# Patient Record
Sex: Female | Born: 1990 | Race: Black or African American | Hispanic: No | Marital: Married | Smoking: Never smoker
Health system: Southern US, Community
[De-identification: ages and names within clinical notes are randomized; demographics above are authoritative.]

## PROBLEM LIST (undated history)

## (undated) DIAGNOSIS — D649 Anemia, unspecified: Secondary | ICD-10-CM

## (undated) DIAGNOSIS — G5600 Carpal tunnel syndrome, unspecified upper limb: Secondary | ICD-10-CM

## (undated) DIAGNOSIS — Z8742 Personal history of other diseases of the female genital tract: Secondary | ICD-10-CM

## (undated) DIAGNOSIS — O26899 Other specified pregnancy related conditions, unspecified trimester: Secondary | ICD-10-CM

## (undated) DIAGNOSIS — Z87440 Personal history of urinary (tract) infections: Secondary | ICD-10-CM

## (undated) HISTORY — DX: Anemia, unspecified: D64.9

## (undated) HISTORY — PX: WISDOM TOOTH EXTRACTION: SHX21

## (undated) HISTORY — DX: Other specified pregnancy related conditions, unspecified trimester: O26.899

## (undated) HISTORY — DX: Personal history of urinary (tract) infections: Z87.440

## (undated) HISTORY — DX: Other specified pregnancy related conditions, unspecified trimester: G56.00

## (undated) HISTORY — DX: Personal history of other diseases of the female genital tract: Z87.42

---

## 2012-08-17 ENCOUNTER — Encounter: Payer: Self-pay | Admitting: Obstetrics and Gynecology

## 2012-08-17 ENCOUNTER — Ambulatory Visit (INDEPENDENT_AMBULATORY_CARE_PROVIDER_SITE_OTHER): Payer: Managed Care, Other (non HMO) | Admitting: Obstetrics and Gynecology

## 2012-08-17 VITALS — BP 112/72 | HR 78 | Wt 220.0 lb

## 2012-08-17 DIAGNOSIS — Z3046 Encounter for surveillance of implantable subdermal contraceptive: Secondary | ICD-10-CM

## 2012-08-17 DIAGNOSIS — N926 Irregular menstruation, unspecified: Secondary | ICD-10-CM

## 2012-08-17 DIAGNOSIS — N912 Amenorrhea, unspecified: Secondary | ICD-10-CM

## 2012-08-17 DIAGNOSIS — R5381 Other malaise: Secondary | ICD-10-CM

## 2012-08-17 DIAGNOSIS — R5383 Other fatigue: Secondary | ICD-10-CM

## 2012-08-17 LAB — CBC
Hemoglobin: 12.9 g/dL (ref 12.0–15.0)
MCH: 25.8 pg — ABNORMAL LOW (ref 26.0–34.0)
MCHC: 32.2 g/dL (ref 30.0–36.0)

## 2012-08-17 NOTE — Patient Instructions (Addendum)
Call [facility] [phone]:  -for temperature of 100.4 degrees Fahrenheit or more -pain not improved with over the counter pain medications (Ibuprofen, Advil, Aleve,     Tylenol or acetaminophen) -for excessive bleeding from insertion site -for excessive swelling redness or green drainage from your insertion site -for any other concerns -keep insertion site clean, dry and covered  for 24 hours -you may remove pressure bandage in 1-4 hours  Multivitamin with at least 400 mcg of folic acid (or more)  OR   Prenatal Vitamins

## 2012-08-17 NOTE — Progress Notes (Signed)
22 YO desiring conception presents for Nexplanon removal. Also reports feeling fatigued for the past several months.  She's been taking iron for the past weeks and feels somewhat better.  Has had episodes of heavy bleeding but not regularly.  O: Nexplanon removed per protocol from medial left upper arm without difficulty and incision closed with Benzoin and ster-strips, dressed with sterile band-aids and 4 x 4 gauze with Kling pressure dressing   A:  Nexplanon removal      Desire to conceive  P:  Multivitamin with at least 400 mcg of folic acid  daily       Call [facility] [phone]:  -for temperature of 100.4 degrees Fahrenheit or more -pain not improved with over the counter pain medications (Ibuprofen, Advil, Aleve,     Tylenol or acetaminophen) -for excessive bleeding from insertion site -for excessive swelling redness or green drainage from your insertion site -for any other concerns -keep insertion site clean, dry and covered  for 24 hours -you may remove pressure bandage in 1-4 hours  RTO- 1 week  Errika Narvaiz, PA-C

## 2012-08-18 ENCOUNTER — Telehealth: Payer: Self-pay | Admitting: Obstetrics and Gynecology

## 2012-08-18 NOTE — Telephone Encounter (Signed)
Called patient to inform of normal CBC.  Left message on recording.  Ernie Sagrero, PA-C

## 2012-08-29 LAB — OB RESULTS CONSOLE HIV ANTIBODY (ROUTINE TESTING): HIV: NONREACTIVE

## 2012-08-29 LAB — OB RESULTS CONSOLE HEPATITIS B SURFACE ANTIGEN: Hepatitis B Surface Ag: NEGATIVE

## 2012-08-29 LAB — OB RESULTS CONSOLE ABO/RH: RH Type: POSITIVE

## 2012-08-29 LAB — OB RESULTS CONSOLE RUBELLA ANTIBODY, IGM: RUBELLA: IMMUNE

## 2012-09-02 ENCOUNTER — Encounter: Payer: Managed Care, Other (non HMO) | Admitting: Obstetrics and Gynecology

## 2014-04-28 ENCOUNTER — Other Ambulatory Visit: Payer: Self-pay | Admitting: Obstetrics and Gynecology

## 2014-04-29 ENCOUNTER — Inpatient Hospital Stay (HOSPITAL_COMMUNITY)
Admission: AD | Admit: 2014-04-29 | Discharge: 2014-05-02 | DRG: 765 | Disposition: A | Discharge status: Home / Self Care | Payer: Managed Care, Other (non HMO) | Source: Ambulatory Visit | Attending: Obstetrics and Gynecology | Admitting: Obstetrics and Gynecology

## 2014-04-29 ENCOUNTER — Encounter (HOSPITAL_COMMUNITY): Payer: Self-pay | Admitting: *Deleted

## 2014-04-29 ENCOUNTER — Encounter (HOSPITAL_COMMUNITY)
Admission: AD | Disposition: A | Discharge status: Home / Self Care | Payer: Self-pay | Source: Ambulatory Visit | Attending: Obstetrics and Gynecology

## 2014-04-29 ENCOUNTER — Encounter (HOSPITAL_COMMUNITY): Payer: Managed Care, Other (non HMO) | Admitting: Registered Nurse

## 2014-04-29 ENCOUNTER — Inpatient Hospital Stay (HOSPITAL_COMMUNITY): Payer: Managed Care, Other (non HMO) | Admitting: Registered Nurse

## 2014-04-29 DIAGNOSIS — Z6841 Body Mass Index (BMI) 40.0 and over, adult: Secondary | ICD-10-CM

## 2014-04-29 DIAGNOSIS — K219 Gastro-esophageal reflux disease without esophagitis: Secondary | ICD-10-CM | POA: Diagnosis present

## 2014-04-29 DIAGNOSIS — O322XX0 Maternal care for transverse and oblique lie, not applicable or unspecified: Principal | ICD-10-CM | POA: Diagnosis present

## 2014-04-29 DIAGNOSIS — O9902 Anemia complicating childbirth: Secondary | ICD-10-CM | POA: Diagnosis present

## 2014-04-29 DIAGNOSIS — O99214 Obesity complicating childbirth: Secondary | ICD-10-CM | POA: Diagnosis present

## 2014-04-29 DIAGNOSIS — D649 Anemia, unspecified: Secondary | ICD-10-CM | POA: Diagnosis present

## 2014-04-29 DIAGNOSIS — Z803 Family history of malignant neoplasm of breast: Secondary | ICD-10-CM | POA: Diagnosis not present

## 2014-04-29 DIAGNOSIS — Z833 Family history of diabetes mellitus: Secondary | ICD-10-CM | POA: Diagnosis not present

## 2014-04-29 DIAGNOSIS — Z800 Family history of malignant neoplasm of digestive organs: Secondary | ICD-10-CM

## 2014-04-29 DIAGNOSIS — E669 Obesity, unspecified: Secondary | ICD-10-CM | POA: Diagnosis present

## 2014-04-29 LAB — ABO/RH: ABO/RH(D): A POS

## 2014-04-29 LAB — TYPE AND SCREEN
ABO/RH(D): A POS
ANTIBODY SCREEN: NEGATIVE

## 2014-04-29 LAB — CBC
HEMATOCRIT: 34.3 % — AB (ref 36.0–46.0)
Hemoglobin: 11.2 g/dL — ABNORMAL LOW (ref 12.0–15.0)
MCH: 26.4 pg (ref 26.0–34.0)
MCHC: 32.7 g/dL (ref 30.0–36.0)
MCV: 80.9 fL (ref 78.0–100.0)
Platelets: 172 10*3/uL (ref 150–400)
RBC: 4.24 MIL/uL (ref 3.87–5.11)
RDW: 15.3 % (ref 11.5–15.5)
WBC: 7.3 10*3/uL (ref 4.0–10.5)

## 2014-04-29 LAB — RPR

## 2014-04-29 SURGERY — Surgical Case
Anesthesia: Spinal | Site: Abdomen

## 2014-04-29 MED ORDER — NALBUPHINE HCL 10 MG/ML IJ SOLN: 5.0000 mg | INTRAMUSCULAR | Status: DC | PRN

## 2014-04-29 MED ORDER — LACTATED RINGERS IV SOLN
INTRAVENOUS | Status: DC
  Administered 2014-04-29: 18:00:00 via INTRAVENOUS

## 2014-04-29 MED ORDER — FERROUS SULFATE 325 (65 FE) MG PO TABS
325.0000 mg | ORAL_TABLET | Freq: Two times a day (BID) | ORAL | Status: DC
  Administered 2014-04-29: 325 mg via ORAL
  Filled 2014-04-29: qty 1

## 2014-04-29 MED ORDER — KETOROLAC TROMETHAMINE 30 MG/ML IJ SOLN
INTRAMUSCULAR | Status: AC
  Filled 2014-04-29: qty 1

## 2014-04-29 MED ORDER — DIPHENHYDRAMINE HCL 25 MG PO CAPS: 25.0000 mg | ORAL_CAPSULE | Freq: Four times a day (QID) | ORAL | Status: DC | PRN

## 2014-04-29 MED ORDER — LANOLIN HYDROUS EX OINT: 1.0000 "application " | TOPICAL_OINTMENT | CUTANEOUS | Status: DC | PRN

## 2014-04-29 MED ORDER — SIMETHICONE 80 MG PO CHEW
80.0000 mg | CHEWABLE_TABLET | Freq: Three times a day (TID) | ORAL | Status: DC
  Administered 2014-04-29 – 2014-05-02 (×8): 80 mg via ORAL
  Filled 2014-04-29 (×8): qty 1

## 2014-04-29 MED ORDER — NALBUPHINE HCL 10 MG/ML IJ SOLN: 5.0000 mg | Freq: Once | INTRAMUSCULAR | Status: AC | PRN

## 2014-04-29 MED ORDER — FENTANYL CITRATE 0.05 MG/ML IJ SOLN
INTRAMUSCULAR | Status: DC | PRN
  Administered 2014-04-29: 10 ug via INTRATHECAL

## 2014-04-29 MED ORDER — TETANUS-DIPHTH-ACELL PERTUSSIS 5-2.5-18.5 LF-MCG/0.5 IM SUSP: 0.5000 mL | Freq: Once | INTRAMUSCULAR | Status: DC

## 2014-04-29 MED ORDER — SODIUM CHLORIDE 0.9 % IJ SOLN: 3.0000 mL | INTRAMUSCULAR | Status: DC | PRN

## 2014-04-29 MED ORDER — OXYCODONE-ACETAMINOPHEN 5-325 MG PO TABS: 2.0000 | ORAL_TABLET | ORAL | Status: DC | PRN

## 2014-04-29 MED ORDER — OXYCODONE-ACETAMINOPHEN 5-325 MG PO TABS
1.0000 | ORAL_TABLET | ORAL | Status: DC | PRN
  Administered 2014-04-30 – 2014-05-02 (×2): 1 via ORAL
  Filled 2014-04-29 (×2): qty 1

## 2014-04-29 MED ORDER — OXYTOCIN 10 UNIT/ML IJ SOLN
INTRAMUSCULAR | Status: AC
  Filled 2014-04-29: qty 4

## 2014-04-29 MED ORDER — METHYLERGONOVINE MALEATE 0.2 MG/ML IJ SOLN: 0.2000 mg | INTRAMUSCULAR | Status: DC | PRN

## 2014-04-29 MED ORDER — ZOLPIDEM TARTRATE 5 MG PO TABS: 5.0000 mg | ORAL_TABLET | Freq: Every evening | ORAL | Status: DC | PRN

## 2014-04-29 MED ORDER — SIMETHICONE 80 MG PO CHEW: 80.0000 mg | CHEWABLE_TABLET | ORAL | Status: DC | PRN

## 2014-04-29 MED ORDER — ONDANSETRON HCL 4 MG PO TABS: 4.0000 mg | ORAL_TABLET | ORAL | Status: DC | PRN

## 2014-04-29 MED ORDER — FENTANYL CITRATE 0.05 MG/ML IJ SOLN: 25.0000 ug | INTRAMUSCULAR | Status: DC | PRN

## 2014-04-29 MED ORDER — ONDANSETRON HCL 4 MG/2ML IJ SOLN
INTRAMUSCULAR | Status: DC | PRN
  Administered 2014-04-29: 4 mg via INTRAVENOUS

## 2014-04-29 MED ORDER — SIMETHICONE 80 MG PO CHEW
80.0000 mg | CHEWABLE_TABLET | ORAL | Status: DC
  Administered 2014-04-30 – 2014-05-02 (×3): 80 mg via ORAL
  Filled 2014-04-29 (×3): qty 1

## 2014-04-29 MED ORDER — ONDANSETRON HCL 4 MG/2ML IJ SOLN
INTRAMUSCULAR | Status: AC
  Filled 2014-04-29: qty 2

## 2014-04-29 MED ORDER — SENNOSIDES-DOCUSATE SODIUM 8.6-50 MG PO TABS
2.0000 | ORAL_TABLET | ORAL | Status: DC
  Administered 2014-04-30 – 2014-05-02 (×3): 2 via ORAL
  Filled 2014-04-29 (×3): qty 2

## 2014-04-29 MED ORDER — IBUPROFEN 600 MG PO TABS
600.0000 mg | ORAL_TABLET | Freq: Four times a day (QID) | ORAL | Status: DC
  Administered 2014-04-29 – 2014-05-02 (×12): 600 mg via ORAL
  Filled 2014-04-29 (×12): qty 1

## 2014-04-29 MED ORDER — KETOROLAC TROMETHAMINE 30 MG/ML IJ SOLN: 30.0000 mg | Freq: Four times a day (QID) | INTRAMUSCULAR | Status: AC | PRN

## 2014-04-29 MED ORDER — DEXAMETHASONE SODIUM PHOSPHATE 4 MG/ML IJ SOLN
INTRAMUSCULAR | Status: DC | PRN
  Administered 2014-04-29: 4 mg via INTRAVENOUS

## 2014-04-29 MED ORDER — ONDANSETRON HCL 4 MG/2ML IJ SOLN: 4.0000 mg | INTRAMUSCULAR | Status: DC | PRN

## 2014-04-29 MED ORDER — SCOPOLAMINE 1 MG/3DAYS TD PT72
MEDICATED_PATCH | TRANSDERMAL | Status: AC
  Administered 2014-04-29: 1.5 mg via TRANSDERMAL
  Filled 2014-04-29: qty 1

## 2014-04-29 MED ORDER — DEXAMETHASONE SODIUM PHOSPHATE 10 MG/ML IJ SOLN
INTRAMUSCULAR | Status: AC
  Filled 2014-04-29: qty 1

## 2014-04-29 MED ORDER — LACTATED RINGERS IV SOLN
Freq: Once | INTRAVENOUS | Status: AC
  Administered 2014-04-29 (×3): via INTRAVENOUS

## 2014-04-29 MED ORDER — BUPIVACAINE HCL (PF) 0.25 % IJ SOLN
INTRAMUSCULAR | Status: AC
  Filled 2014-04-29: qty 30

## 2014-04-29 MED ORDER — DIPHENHYDRAMINE HCL 50 MG/ML IJ SOLN
12.5000 mg | INTRAMUSCULAR | Status: DC | PRN
  Administered 2014-04-30: 12.5 mg via INTRAVENOUS
  Filled 2014-04-29: qty 1

## 2014-04-29 MED ORDER — CEFAZOLIN SODIUM-DEXTROSE 2-3 GM-% IV SOLR
INTRAVENOUS | Status: AC
Start: 2014-04-29 — End: 2014-04-29
  Administered 2014-04-29: 2 g via INTRAVENOUS
  Filled 2014-04-29: qty 50

## 2014-04-29 MED ORDER — PHENYLEPHRINE 8 MG IN D5W 100 ML (0.08MG/ML) PREMIX OPTIME
INJECTION | INTRAVENOUS | Status: AC
  Filled 2014-04-29: qty 100

## 2014-04-29 MED ORDER — MORPHINE SULFATE 0.5 MG/ML IJ SOLN
INTRAMUSCULAR | Status: AC
  Filled 2014-04-29: qty 10

## 2014-04-29 MED ORDER — FENTANYL CITRATE 0.05 MG/ML IJ SOLN
INTRAMUSCULAR | Status: AC
  Filled 2014-04-29: qty 2

## 2014-04-29 MED ORDER — BUPIVACAINE HCL (PF) 0.25 % IJ SOLN
INTRAMUSCULAR | Status: DC | PRN
  Administered 2014-04-29: 20 mL

## 2014-04-29 MED ORDER — DIPHENHYDRAMINE HCL 25 MG PO CAPS
25.0000 mg | ORAL_CAPSULE | ORAL | Status: DC | PRN
  Filled 2014-04-29: qty 1

## 2014-04-29 MED ORDER — PHENYLEPHRINE 8 MG IN D5W 100 ML (0.08MG/ML) PREMIX OPTIME
INJECTION | INTRAVENOUS | Status: DC | PRN
  Administered 2014-04-29: 60 ug/min via INTRAVENOUS

## 2014-04-29 MED ORDER — MENTHOL 3 MG MT LOZG: 1.0000 | LOZENGE | OROMUCOSAL | Status: DC | PRN

## 2014-04-29 MED ORDER — METHYLERGONOVINE MALEATE 0.2 MG PO TABS: 0.2000 mg | ORAL_TABLET | ORAL | Status: DC | PRN

## 2014-04-29 MED ORDER — WITCH HAZEL-GLYCERIN EX PADS: 1.0000 "application " | MEDICATED_PAD | CUTANEOUS | Status: DC | PRN

## 2014-04-29 MED ORDER — MEPERIDINE HCL 25 MG/ML IJ SOLN: 6.2500 mg | INTRAMUSCULAR | Status: DC | PRN

## 2014-04-29 MED ORDER — OXYTOCIN 10 UNIT/ML IJ SOLN
40.0000 [IU] | INTRAVENOUS | Status: DC | PRN
  Administered 2014-04-29: 40 [IU] via INTRAVENOUS

## 2014-04-29 MED ORDER — PRENATAL MULTIVITAMIN CH
1.0000 | ORAL_TABLET | Freq: Every day | ORAL | Status: DC
  Administered 2014-04-30 – 2014-05-02 (×3): 1 via ORAL
  Filled 2014-04-29 (×3): qty 1

## 2014-04-29 MED ORDER — SCOPOLAMINE 1 MG/3DAYS TD PT72
1.0000 | MEDICATED_PATCH | Freq: Once | TRANSDERMAL | Status: DC
  Administered 2014-04-29: 1.5 mg via TRANSDERMAL

## 2014-04-29 MED ORDER — BUPIVACAINE IN DEXTROSE 0.75-8.25 % IT SOLN
INTRATHECAL | Status: DC | PRN
  Administered 2014-04-29: 11.25 mg via INTRATHECAL

## 2014-04-29 MED ORDER — MORPHINE SULFATE (PF) 0.5 MG/ML IJ SOLN
INTRAMUSCULAR | Status: DC | PRN
  Administered 2014-04-29: .2 mg via INTRATHECAL

## 2014-04-29 MED ORDER — DEXAMETHASONE SODIUM PHOSPHATE 4 MG/ML IJ SOLN
INTRAMUSCULAR | Status: AC
  Filled 2014-04-29: qty 1

## 2014-04-29 MED ORDER — OXYTOCIN 40 UNITS IN LACTATED RINGERS INFUSION - SIMPLE MED: 62.5000 mL/h | INTRAVENOUS | Status: AC

## 2014-04-29 MED ORDER — DIBUCAINE 1 % RE OINT
1.0000 | TOPICAL_OINTMENT | RECTAL | Status: DC | PRN
Start: 2014-04-29 — End: 2014-05-02

## 2014-04-29 MED ORDER — NALOXONE HCL 1 MG/ML IJ SOLN
1.0000 ug/kg/h | INTRAMUSCULAR | Status: DC | PRN
  Filled 2014-04-29: qty 2

## 2014-04-29 MED ORDER — NALOXONE HCL 0.4 MG/ML IJ SOLN: 0.4000 mg | INTRAMUSCULAR | Status: DC | PRN

## 2014-04-29 MED ORDER — MEASLES, MUMPS & RUBELLA VAC ~~LOC~~ INJ: 0.5000 mL | INJECTION | Freq: Once | SUBCUTANEOUS | Status: DC

## 2014-04-29 MED ORDER — KETOROLAC TROMETHAMINE 30 MG/ML IJ SOLN
30.0000 mg | Freq: Four times a day (QID) | INTRAMUSCULAR | Status: AC | PRN
  Administered 2014-04-29: 30 mg via INTRAVENOUS

## 2014-04-29 MED ORDER — ONDANSETRON HCL 4 MG/2ML IJ SOLN: 4.0000 mg | Freq: Three times a day (TID) | INTRAMUSCULAR | Status: DC | PRN

## 2014-04-29 SURGICAL SUPPLY — 37 items
BENZOIN TINCTURE PRP APPL 2/3 (GAUZE/BANDAGES/DRESSINGS) ×3 IMPLANT
BLADE SURG 10 STRL SS (BLADE) ×6 IMPLANT
BOOTIES KNEE HIGH SLOAN (MISCELLANEOUS) ×6 IMPLANT
CLAMP CORD UMBIL (MISCELLANEOUS) ×3 IMPLANT
CLOSURE WOUND 1/2 X4 (GAUZE/BANDAGES/DRESSINGS) ×1
CLOTH BEACON ORANGE TIMEOUT ST (SAFETY) ×3 IMPLANT
DRAIN JACKSON PRT FLT 10 (DRAIN) IMPLANT
DRAPE SHEET LG 3/4 BI-LAMINATE (DRAPES) ×3 IMPLANT
DRSG OPSITE POSTOP 4X10 (GAUZE/BANDAGES/DRESSINGS) ×3 IMPLANT
DURAPREP 26ML APPLICATOR (WOUND CARE) ×3 IMPLANT
ELECT REM PT RETURN 9FT ADLT (ELECTROSURGICAL) ×3
ELECTRODE REM PT RTRN 9FT ADLT (ELECTROSURGICAL) ×1 IMPLANT
EVACUATOR SILICONE 100CC (DRAIN) IMPLANT
EXTRACTOR VACUUM M CUP 4 TUBE (SUCTIONS) IMPLANT
EXTRACTOR VACUUM M CUP 4' TUBE (SUCTIONS)
GLOVE BIOGEL PI IND STRL 7.0 (GLOVE) ×1 IMPLANT
GLOVE BIOGEL PI INDICATOR 7.0 (GLOVE) ×2
GLOVE ECLIPSE 6.5 STRL STRAW (GLOVE) ×3 IMPLANT
GOWN STRL REUS W/TWL LRG LVL3 (GOWN DISPOSABLE) ×9 IMPLANT
KIT ABG SYR 3ML LUER SLIP (SYRINGE) IMPLANT
NEEDLE HYPO 22GX1.5 SAFETY (NEEDLE) ×3 IMPLANT
NEEDLE HYPO 25X5/8 SAFETYGLIDE (NEEDLE) ×3 IMPLANT
NS IRRIG 1000ML POUR BTL (IV SOLUTION) ×3 IMPLANT
PACK C SECTION WH (CUSTOM PROCEDURE TRAY) ×3 IMPLANT
PAD OB MATERNITY 4.3X12.25 (PERSONAL CARE ITEMS) ×3 IMPLANT
RTRCTR C-SECT PINK 25CM LRG (MISCELLANEOUS) ×3 IMPLANT
STRIP CLOSURE SKIN 1/2X4 (GAUZE/BANDAGES/DRESSINGS) ×2 IMPLANT
SUT CHROMIC GUT AB #0 18 (SUTURE) IMPLANT
SUT MNCRL AB 3-0 PS2 27 (SUTURE) ×6 IMPLANT
SUT SILK 2 0 FSL 18 (SUTURE) IMPLANT
SUT VIC AB 0 CTX 36 (SUTURE) ×4
SUT VIC AB 0 CTX36XBRD ANBCTRL (SUTURE) ×2 IMPLANT
SUT VIC AB 1 CT1 36 (SUTURE) ×6 IMPLANT
SYR 20CC LL (SYRINGE) ×3 IMPLANT
TOWEL OR 17X24 6PK STRL BLUE (TOWEL DISPOSABLE) ×3 IMPLANT
TRAY FOLEY CATH 14FR (SET/KITS/TRAYS/PACK) ×3 IMPLANT
WATER STERILE IRR 1000ML POUR (IV SOLUTION) IMPLANT

## 2014-04-29 NOTE — Anesthesia Postprocedure Evaluation (Signed)
  Anesthesia Post-op Note  Patient: Tammy Cochran  Procedure(s) Performed: Procedure(s): CESAREAN SECTION (N/A)  Patient Location: PACU and Mother/Baby  Anesthesia Type:Spinal  Level of Consciousness: awake, alert , oriented and patient cooperative  Airway and Oxygen Therapy: Patient Spontanous Breathing  Post-op Pain: none  Post-op Assessment: Post-op Vital signs reviewed, Patient's Cardiovascular Status Stable, Respiratory Function Stable, Patent Airway, No signs of Nausea or vomiting, Adequate PO intake, Pain level controlled, No headache, No backache, No residual numbness and No residual motor weakness  Post-op Vital Signs: Reviewed and stable  Last Vitals:  Filed Vitals:   04/29/14 1443  BP: 101/62  Pulse: 74  Temp: 36.9 C  Resp: 20    Complications: No apparent anesthesia complications

## 2014-04-29 NOTE — Interval H&P Note (Signed)
History and Physical Interval Note:  04/29/2014 9:25 AM  Tammy Cochran  has presented today for surgery, with the diagnosis of Transverse Lie  The various methods of treatment have been discussed with the patient and family. After consideration of risks, benefits and other options for treatment, the patient has consented to  Procedure(s): CESAREAN SECTION (N/A) as a surgical intervention .  The patient's history has been reviewed, patient examined, no change in status, stable for surgery.  I have reviewed the patient's chart and labs.  Questions were answered to the patient's satisfaction.     [name] A

## 2014-04-29 NOTE — Lactation Note (Signed)
This note was copied from the chart of Tammy Cochran. Lactation Consultation Note  Patient Name: Tammy Ersie Flinders Today's Date: 04/29/2014 Reason for consult: Initial assessment of this primipara and her newborn now 9 hours postpartum.  Baby has been sleepy since birth and only achieving brief latches a few times with no sustained breastfeeding and no LATCH score.  Mom holding baby and no feeding cues noted.  Mom has room full of visitors at this time.  LC reviewed STS, cue feedings and hand expression and recommends mom ask her nurse to demonstrate hand expression technique at next feeding (due to visitors). LC encouraged review of Baby and Me pp 9, 14 and 20-25 for STS and BF information. LC provided [employer] brochure and reviewed [facility] services and list of community and web site resources. Mom encouraged to feed baby 8-12 times/24 hours and with feeding cues.   Maternal Data Formula Feeding for Exclusion: No Has patient been taught Hand Expression?: No (not documented and no LATCH score; mom to ask her nurse at next feeding attempt) Does the patient have breastfeeding experience prior to this delivery?: No  Feeding    LATCH Score/Interventions            No LATCH score yet; only brief attempts for 3-5 minutes          Lactation Tools Discussed/Used   STS, hand expression, cue feedings  Consult Status Consult Status: Follow-up Date: 04/30/14 Follow-up type: In-patient    Warrick Parisian [facility] 04/29/2014, 8:04 PM

## 2014-04-29 NOTE — Op Note (Signed)
Preoperative diagnosis: Intrauterine pregnancy at 39 weeks and 4 days with transverse lie  Post operative diagnosis: Same  Anesthesia: Spinal  Anesthesiologist: Dr. Arby Barrette  Procedure: Primary low transverse cesarean section  Surgeon: Dr. Dois Davenport Zachari Alberta  Assistant: Sherre Scarlet CNM  Estimated blood loss: 800 cc  Procedure:  After confirming transverse lie by bedside ultrasound and  in holdingbeing informed of the planned procedure and possible complications including bleeding, infection, injury to other organs, informed consent is obtained. The patient is taken to OR #9 and given spinal anesthesia without complication. She is placed in the dorsal decubitus position with the pelvis tilted to the left. She is then prepped and draped in a sterile fashion. A Foley catheter is inserted in her bladder.  After assessing adequate level of anesthesia, we infiltrate the suprapubic area with 20 cc of Marcaine 0.25 and perform a Pfannenstiel incision which is brought down sharply to the fascia. The fascia is entered in a low transverse fashion. Linea alba is dissected. Peritoneum is entered in a midline fashion. An Alexis retractor is easily positioned.   The myometrium is then entered in a low transverse fashion, 2 cm above the vesico-uterine junction ; first with knife and then extended bluntly. Amniotic fluid is clear. We assist the birth of a female  Infant now converted  in vertex presentation. Mouth and nose are suctioned. The baby is delivered. The cord is clamped and sectioned. The baby is given to the neonatologist present in the room.  10 cc of blood is drawn from the umbilical vein.The placenta is allowed to deliver spontaneously. It is complete and the cord has 3 vessels. Uterine revision is negative.  We proceed with closure of the myometrium in 2 layers: First with a running locked suture of 0 Vicryl, then with a Lembert suture of 0 Vicryl imbricating the first one. Hemostasis is  completed with cauterization on peritoneal edges.  Both paracolic gutters are cleaned. Both tubes and ovaries are assessed and normal. The pelvis is profusely irrigated with warm saline to confirm a satisfactory hemostasis.  Retractors and sponges are removed. Under fascia hemostasis is completed with cauterization. The fascia is then closed with 2 running sutures of 0 Vicryl meeting midline. The wound is irrigated with warm saline and hemostasis is completed with cauterization. The skin is closed with a subcuticular suture of 3-0 Monocryl and Steri-Strips.  Instrument and sponge count is complete x2. Estimated blood loss is 800 cc.  The procedure is well tolerated by the patient who is taken to recovery room in a well and stable condition.  female baby named Vernell Morgans was born at 10:44 and received an Apgar of 6  at 1 minute and 8 at 5 minutes.    Specimen: Placenta sent to L & D   Glyn Zendejas A MD 9/18/201511:15 AM

## 2014-04-29 NOTE — H&P (Signed)
Tammy Cochran is a 23 y.o. female, G1P0 at 39.4 weeks, presenting for primary LTCS due to transverse lie presentation noted on 04/28/14 scan. Reports active fetus. Denies ctxs, LOF or VB.  Problem List:  Morbid obesity, BMI 42.1 Anemia since age 26 S>D Transverse lie presentation (04/28/14)   History of present pregnancy:  Patient entered care at 10.2 weeks.  EDC of 05/02/14 was established by sure LMP and that was c/w 18.3 wk sono.  Anatomy scan: 18.3 weeks, with normal findings and an anterior placenta.  Additional [place] evaluations:  39.3 wks due to S>D: Transverse presentation (head maternal left), EFW 7-14 (56th%tile), normal fluid, AFI 14.7 cm (55th%), BPP 8/8 in 3 min, anterior placenta, adnexas WNL. Significant prenatal events: Struggled w/ N/V, hip pain and swelling in hands/feet. Started out taking Zofran for N/V but discontinued use due to constipation; was switched to Diclegis which improved her sxs. NOB urine culture positive for e.coli. Pt. Never took medication due to N/V. Urine culture on 01/28/14 was w/o predominant bacteria. No MAU visits or hospitalizations this pregnancy.  Last evaluation: 04/28/14 @ 39.3 wks. Transverse presentation. Options discussed for ECV or primary c-section. Couple elected primary c-section.  OB History    Grav  Para  Term  Preterm  Abortions  TAB  SAB  Ect  Mult  Living    0               Past Medical History   Diagnosis  Date   .  H/O dysmenorrhea    Anemia - diagnosed at age 67 Past Surgical History   Procedure  Laterality  Date   .  Wisdom tooth extraction      Family History: family history includes Cancer in her paternal grandmother; Diabetes in her sister; Hypertension in her maternal grandmother and maternal uncle. Diabetes and anemia in her paternal aunt, malignant tumor of breast in her MGM and malignant tumor of colon in her MGF.   Social History: reports that she has never smoked. She has never used smokeless tobacco. She reports that  she does not drink alcohol or use illicit drugs. Pt is an African-American female w/ a 4-yr college degree who is employed as an Psychologist, clinical. She is of the [place] faith and married to [place] who is present. Received Tdap but declined flu vaccine.  Prenatal Transfer Tool   Maternal Diabetes: No  Genetic Screening: Declined  Maternal Ultrasounds/Referrals: Normal, but transverse presentation  Fetal Ultrasounds or other Referrals: None  Maternal Substance Abuse: No  Significant Maternal Medications: PNVs, OTC Iron, Diclegis Significant Maternal Lab Results: Lab values include: Group B Strep negative   ROS: +FM   No Known Allergies   Chest clear  Heart RRR without murmur  Abd gravid, NT, FH S>D Pelvic: Deferred Ext: WNL FHR: 148 bpm on 04/28/14, had u/s on 04/28/14; BPP 8/8 in 3 min UCs: None   Prenatal labs:  ABO, Rh: A+ (09/08/13) Antibody: Neg (09/08/13) Rubella: Immune (09/08/13) RPR: NR (09/08/13) (01/28/14) HBsAg: Neg (09/08/13) HIV: Neg (09/08/13) GBS: Neg (04/08/14) Sickle cell/Hgb electrophoresis: AA (09/08/13) Pap: Normal on 10/06/13 - +yeast; no tx  GC: Neg on 09/08/13 and 04/08/14  Chlamydia: Neg on 09/08/13 and 04/08/14 Genetic screenings: Declined Glucola: Normal (87) on 12/09/13 & 01/28/14 Other: NOB urine culture pos for e.coli (pt did not take prescribed medication due to N/V). Urine culture w/o predominant bacteria on 01/28/14. NOB Hbg 12.3, 9.7 at 28 wks.   Assessment/Plan:  IUP at  39 4/7 weeks  Primary C/S due to transverse presentation  GBS negative  Morbid obesity  Anemia  Plan:  Admit to [facility] per consult with Dr. Estanislado Pandy. Routine pre-op orders.  Check presentation prior to surgery.    Sherre Scarlet, CNM, MS 04/29/14 @ 7:34 AM

## 2014-04-29 NOTE — Anesthesia Procedure Notes (Signed)
Spinal  Patient location during procedure: OR Start time: 04/29/2014 9:45 AM End time: 04/29/2014 9:58 AM Staffing Anesthesiologist: Leilani Able Performed by: anesthesiologist  Preanesthetic Checklist Completed: patient identified, surgical consent, pre-op evaluation, timeout performed, IV checked, risks and benefits discussed and monitors and equipment checked Spinal Block Patient position: sitting Prep: site prepped and draped and DuraPrep Patient monitoring: heart rate, cardiac monitor, continuous pulse ox and blood pressure Approach: midline Location: L3-4 Injection technique: single-shot Needle Needle type: Sprotte  Needle gauge: 24 G Needle length: 9 cm Needle insertion depth: 10 cm Assessment Sensory level: T10 Events: paresthesia Additional Notes Needed 17G Tuohy X 1 to 8cm followed by Sprotte to + CSF. L leg paresthesia X ! Needle removed immediately and injected only after pt confirmed no longer present.

## 2014-04-29 NOTE — Transfer of Care (Signed)
Immediate Anesthesia Transfer of Care Note  Patient: Tammy Cochran  Procedure(s) Performed: Procedure(s): CESAREAN SECTION (N/A)  Patient Location: PACU  Anesthesia Type:Spinal  Level of Consciousness: awake, alert  and oriented  Airway & Oxygen Therapy: Patient Spontanous Breathing  Post-op Assessment: Report given to PACU RN  Post vital signs: Reviewed  Complications: No apparent anesthesia complications

## 2014-04-29 NOTE — Anesthesia Preprocedure Evaluation (Addendum)
Anesthesia Evaluation  Patient identified by MRN, date of birth, ID band Patient awake    Reviewed: Allergy & Precautions, H&P , NPO status , Patient's Chart, lab work & pertinent test results  Airway Mallampati: III TM Distance: >3 FB Neck ROM: Full    Dental no notable dental hx. (+) Teeth Intact   Pulmonary neg pulmonary ROS,  breath sounds clear to auscultation  Pulmonary exam normal       Cardiovascular negative cardio ROS  Rhythm:Regular Rate:Normal     Neuro/Psych negative neurological ROS  negative psych ROS   GI/Hepatic Neg liver ROS, GERD-  ,  Endo/Other  Morbid obesity  Renal/GU negative Renal ROS  negative genitourinary   Musculoskeletal negative musculoskeletal ROS (+)   Abdominal (+) + obese,   Peds  Hematology  (+) anemia ,   Anesthesia Other Findings   Reproductive/Obstetrics Transverse lie                          Anesthesia Physical Anesthesia Plan  ASA: III  Anesthesia Plan: Spinal   Post-op Pain Management:    Induction:   Airway Management Planned: Natural Airway  Additional Equipment:   Intra-op Plan:   Post-operative Plan:   Informed Consent: I have reviewed the patients History and Physical, chart, labs and discussed the procedure including the risks, benefits and alternatives for the proposed anesthesia with the patient or authorized representative who has indicated his/her understanding and acceptance.     Plan Discussed with: Anesthesiologist, CRNA and Surgeon  Anesthesia Plan Comments:         Anesthesia Quick Evaluation

## 2014-04-30 DIAGNOSIS — O322XX0 Maternal care for transverse and oblique lie, not applicable or unspecified: Secondary | ICD-10-CM | POA: Diagnosis present

## 2014-04-30 LAB — CBC
HEMATOCRIT: 28.4 % — AB (ref 36.0–46.0)
HEMOGLOBIN: 9.2 g/dL — AB (ref 12.0–15.0)
MCH: 26.1 pg (ref 26.0–34.0)
MCHC: 32.4 g/dL (ref 30.0–36.0)
MCV: 80.5 fL (ref 78.0–100.0)
Platelets: 175 10*3/uL (ref 150–400)
RBC: 3.53 MIL/uL — AB (ref 3.87–5.11)
RDW: 15.3 % (ref 11.5–15.5)
WBC: 11.9 10*3/uL — AB (ref 4.0–10.5)

## 2014-04-30 LAB — BIRTH TISSUE RECOVERY COLLECTION (PLACENTA DONATION)

## 2014-04-30 MED ORDER — FERROUS SULFATE 325 (65 FE) MG PO TABS
325.0000 mg | ORAL_TABLET | Freq: Every day | ORAL | Status: DC
  Administered 2014-05-01 – 2014-05-02 (×2): 325 mg via ORAL
  Filled 2014-04-30 (×2): qty 1

## 2014-04-30 NOTE — Progress Notes (Signed)
Subjective: Postpartum Day 1: Cesarean Delivery due to transverse lie Patient up ad lib, reports no syncope or dizziness.  Voiding without difficulty since foley removed. Feeding:  Breast--having some issues with latch. Contraceptive plan:  Undecided  Objective: Vital signs in last 24 hours: Temp:  [97.7 F (36.5 C)-98.6 F (37 C)] 97.9 F (36.6 C) (09/19 0605) Pulse Rate:  [67-95] 73 (09/19 0605) Resp:  [15-23] 16 (09/19 0605) BP: (88-124)/(49-77) 101/50 mmHg (09/19 0605) SpO2:  [96 %-100 %] 99 % (09/19 0605) Weight:  [227 lb (102.967 kg)] 227 lb (102.967 kg) (09/18 1610)  Physical Exam:  General: alert Lochia: appropriate Uterine Fundus: firm Abdomen:  + bowel sounds, mild gaseous distension Incision: Honeycomb dressing CDI DVT Evaluation: No evidence of DVT seen on physical exam. Negative Homan's sign.    Recent Labs  04/29/14 0832 04/30/14 0615  HGB 11.2* 9.2*  HCT 34.3* 28.4*  WBC 7.3 11.9*  Orthostatics stable  Assessment/Plan: Status post Cesarean section day 1. Mild anemia without hemodynamic instability. Doing well postoperatively.  Continue current care. LCs to continue to work with patient. Fe q day. Reviewed option of early d/c tomorrow--will assess for that tomorrow.    Nigel Bridgeman CNM, [place] 04/30/2014, 8:18 AM

## 2014-04-30 NOTE — Discharge Summary (Signed)
Cesarean Section Delivery Discharge Summary  Tammy Cochran  DOB:    01/22/1991 MRN:    [id number] CSN:    [id number]  Date of admission:                  04/29/14  Date of discharge:                   05/02/14  Procedures this admission:  Primary LTCS due to transverse lie  Date of Delivery: 04/29/14  Newborn Data:  Live born female  Birth Weight: 7 lb 15.7 oz (3620 g) APGAR: 6, 8  Home with mother. Name: Tammy Cochran   History of Present Illness:  [name] is a 23 y.o. female, G1P1001, who presents at [redacted]w[redacted]d weeks gestation. The patient has been followed at [facility].    Her pregnancy has been complicated by:  Patient Active Problem List   Diagnosis Date Noted  . Transverse fetal lie 04/30/2014  . Severe obesity (BMI >= 40) 04/30/2014  . Cesarean delivery delivered 04/29/2014    Hospital Course--Scheduled Cesarean: Patient was admitted on 04/29/14 for a scheduled primary cesarean delivery due to transverse lie.   She was taken to the operating room, where Dr. Estanislado Pandy performed a primary LTCS under spinal anesthesia, with delivery of a viable female, with weight and Apgars as listed below. Infant was in good condition and remained at the patient's bedside.  The patient was taken to recovery in good condition.  Patient planned to breast feed.  On post-op day 1, patient was doing well, tolerating a regular diet, with Hgb of 9.2, with stable orthostatics.  Throughout her stay, her physical exam was WNL, her incision was CDI, and her vital signs remained stable.  By post-op day 3, she was up ad lib, tolerating a regular diet, with good pain control with po med.  She was deemed to have received the full benefit of her hospital stay, and was discharged home in stable condition.  Contraceptive choice--considering Mirena, with final decision to follow.  Written information regarding Mirena provided for patient  review on d/c.    Feeding:  breast  Contraception:  Considering Mirena--information given at d/c.  Discharge hemoglobin:  Hemoglobin  Date Value Ref Range Status  04/30/2014 9.2* 12.0 - 15.0 g/dL Final     REPEATED TO VERIFY     DELTA CHECK NOTED  04/29/2014 11.2* 12.0 - 15.0 g/dL Final  02/17/2955 21.3  08.6 - 15.0 g/dL Final     HCT  Date Value Ref Range Status  04/30/2014 28.4* 36.0 - 46.0 % Final  04/29/2014 34.3* 36.0 - 46.0 % Final  08/17/2012 40.1  36.0 - 46.0 % Final     WBC  Date Value Ref Range Status  04/30/2014 11.9* 4.0 - 10.5 K/uL Final  04/29/2014 7.3  4.0 - 10.5 K/uL Final  08/17/2012 5.8  4.0 - 10.5 K/uL Final    Discharge Physical Exam:   General: alert Lochia: appropriate Uterine Fundus: firm Abdomen:  + bowel sounds Incision: Honeycomb dressing CDI DVT Evaluation: No evidence of DVT seen on physical exam. Negative Homan's sign.  Intrapartum Procedures: cesarean: low cervical, transverse, due to transverse lie Postpartum Procedures: none Complications-Operative and Postpartum: Mild anemia without hemodynamic instability.  Discharge Diagnoses: Term Pregnancy-delivered, transverse lie, primary cesarean birth, mild anemia  Discharge Information:  Activity:           pelvic rest Diet:  routine Medications: Ibuprofen, Iron and Percocet Condition:      stable Instructions:  Discharge to: home     Nigel Bridgeman [facility] 04/30/2014 8:23 AM

## 2014-05-01 NOTE — Progress Notes (Signed)
Subjective: Postpartum Day 2: Cesarean Delivery due to transverse lie Patient up ad lib, reports no syncope or dizziness. Feeding:  Breastfeeding Contraceptive plan:  Undecided  Objective: Vital signs in last 24 hours: Temp:  [98.2 F (36.8 C)-99.6 F (37.6 C)] 98.9 F (37.2 C) (09/20 0659) Pulse Rate:  [67-79] 67 (09/20 0659) Resp:  [18] 18 (09/20 0659) BP: (108-115)/(56-57) 115/56 mmHg (09/20 0659) SpO2:  [99 %] 99 % (09/19 1800)  Physical Exam:  General: alert Lochia: appropriate Uterine Fundus: firm Abdomen:  + bowel sounds, mild gaseous distension Incision: Honeycomb dressing CDI DVT Evaluation: No evidence of DVT seen on physical exam. Negative Homan's sign. Calf/Ankle edema is present, 1+ bilaterally   Recent Labs  04/29/14 0832 04/30/14 0615  HGB 11.2* 9.2*  HCT 34.3* 28.4*  WBC 7.3 11.9*    Assessment/Plan: Status post Cesarean section day 2. Doing well postoperatively.  Continue current care. Plan for discharge tomorrow Reviewed contraceptive options--patient will consider.    Wellington Winegarden CNM, [place] 05/01/2014, 9:20 AM

## 2014-05-01 NOTE — Progress Notes (Signed)
Patient ID: Tammy Cochran, female   DOB: Dec 10, 1990, 23 y.o.   MRN: [id number] I saw patient at bedside with CNM Horald Chestnut and agree with her findings, assessment and plan as per her progress note today. Pt. On iron tabs for anemia.

## 2014-05-02 ENCOUNTER — Encounter (HOSPITAL_COMMUNITY): Payer: Self-pay | Admitting: Obstetrics and Gynecology

## 2014-05-02 MED ORDER — IBUPROFEN 600 MG PO TABS: 600.0000 mg | ORAL_TABLET | Freq: Four times a day (QID) | ORAL | Status: DC | PRN

## 2014-05-02 MED ORDER — OXYCODONE-ACETAMINOPHEN 5-325 MG PO TABS: 1.0000 | ORAL_TABLET | ORAL | Status: DC | PRN

## 2014-05-02 NOTE — Lactation Note (Signed)
This note was copied from the chart of Tammy Gerald Pistilli. Lactation Consultation Note Noted baby w/10% wt. Loss. Baby has had 18 voids and 8 stools since birth. This might account for some of the wt. Loss. Mom has good flow of transitioning colostrum. Mom isn't using NS, nipples are everted well, mom is pre-pumping w/hand pump to erect nipples more for a deeper latch. Baby has wide open flange. Discussed "milk transfer" could be d/t wt. Loss. Noted hearing baby gulping at times when mom massaged breast during feedings. Encouraged mom to post-pump and give colostrum in syring. Baby finished feeding is sound a sleep. Encouraged STS during feedings. Discussed how 10% is more wt. Loss than we like to see and to make sure to wake baby for feedings.  Patient Name: Tammy Cochran Date: 05/02/2014 Reason for consult: Follow-up assessment;Infant weight loss   Maternal Data    Feeding Feeding Type: Breast Fed Length of feed: 15 min  LATCH Score/Interventions Latch: Grasps breast easily, tongue down, lips flanged, rhythmical sucking. Intervention(s): Breast massage;Breast compression  Audible Swallowing: Spontaneous and intermittent Intervention(s): Alternate breast massage  Type of Nipple: Everted at rest and after stimulation Intervention(s): Hand pump  Comfort (Breast/Nipple): Filling, red/small blisters or bruises, mild/mod discomfort  Problem noted: Mild/Moderate discomfort Interventions (Mild/moderate discomfort): Hand massage;Pre-pump if needed;Comfort gels  Hold (Positioning): No assistance needed to correctly position infant at breast. Intervention(s): Skin to skin  LATCH Score: 9  Lactation Tools Discussed/Used Tools: Pump Breast pump type: Manual   Consult Status Consult Status: Follow-up Date: 05/02/14 Follow-up type: In-patient    [name], [name] 05/02/2014, 1:16 AM

## 2014-05-02 NOTE — Lactation Note (Signed)
This note was copied from the chart of Tammy Janeth Derksen. Lactation Consultation Note: Mom reports that baby has been nursing much better. Has been several hours since last feeding. Undressed baby and diaper changed. After a few attempts baby latched well and lots of swallows noted. Mom easily able to hand express whitish milk. Reports that breasts started feeling heavier this morning. No questions at present. Reviewed BFS[facility] and OP appointments as resources for support after DC. To call prn  Patient Name: Tammy Cochran Date: 05/02/2014 Reason for consult: Follow-up assessment   Maternal Data Formula Feeding for Exclusion: No Has patient been taught Hand Expression?: Yes Does the patient have breastfeeding experience prior to this delivery?: No  Feeding Feeding Type: Breast Fed  LATCH Score/Interventions Latch: Grasps breast easily, tongue down, lips flanged, rhythmical sucking.  Audible Swallowing: Spontaneous and intermittent  Type of Nipple: Everted at rest and after stimulation  Comfort (Breast/Nipple): Soft / non-tender     Hold (Positioning): Assistance needed to correctly position infant at breast and maintain latch.  LATCH Score: 9  Lactation Tools Discussed/Used     Consult Status Consult Status: Complete    Pamelia Hoit 05/02/2014, 10:00 AM

## 2014-05-02 NOTE — Discharge Instructions (Signed)
Postpartum Care After Cesarean Delivery °After you deliver your newborn (postpartum period), the usual stay in the hospital is 24-72 hours. If there were problems with your labor or delivery, or if you have other medical problems, you might be in the hospital longer.  °While you are in the hospital, you will receive help and instructions on how to care for yourself and your newborn during the postpartum period.  °While you are in the hospital: °· It is normal for you to have pain or discomfort from the incision in your abdomen. Be sure to tell your nurses when you are having pain, where the pain is located, and what makes the pain worse. °· If you are breastfeeding, you may feel uncomfortable contractions of your uterus for a couple of weeks. This is normal. The contractions help your uterus get back to normal size. °· It is normal to have some bleeding after delivery. °¨ For the first 1-3 days after delivery, the flow is red and the amount may be similar to a period. °¨ It is common for the flow to start and stop. °¨ In the first few days, you may pass some small clots. Let your nurses know if you begin to pass large clots or your flow increases. °¨ Do not  flush blood clots down the toilet before having the nurse look at them. °¨ During the next 3-10 days after delivery, your flow should become more watery and pink or brown-tinged in color. °¨ Ten to fourteen days after delivery, your flow should be a small amount of yellowish-white discharge. °¨ The amount of your flow will decrease over the first few weeks after delivery. Your flow may stop in 6-8 weeks. Most women have had their flow stop by 12 weeks after delivery. °· You should change your sanitary pads frequently. °· Wash your hands thoroughly with soap and water for at least 20 seconds after changing pads, using the toilet, or before holding or feeding your newborn. °· Your intravenous (IV) tubing will be removed when you are drinking enough fluids. °· The  urine drainage tube (urinary catheter) that was inserted before delivery may be removed within 6-8 hours after delivery or when feeling returns to your legs. You should feel like you need to empty your bladder within the first 6-8 hours after the catheter has been removed. °· In case you become weak, lightheaded, or faint, call your nurse before you get out of bed for the first time and before you take a shower for the first time. °· Within the first few days after delivery, your breasts may begin to feel tender and full. This is called engorgement. Breast tenderness usually goes away within 48-72 hours after engorgement occurs. You may also notice milk leaking from your breasts. If you are not breastfeeding, do not stimulate your breasts. Breast stimulation can make your breasts produce more milk. °· Spending as much time as possible with your newborn is very important. During this time, you and your newborn can feel close and get to know each other. Having your newborn stay in your room (rooming in) will help to strengthen the bond with your newborn. It will give you time to get to know your newborn and become comfortable caring for your newborn. °· Your hormones change after delivery. Sometimes the hormone changes can temporarily cause you to feel sad or tearful. These feelings should not last more than a few days. If these feelings last longer than that, you should talk to your   caregiver.  If desired, talk to your caregiver about methods of family planning or contraception.  Talk to your caregiver about immunizations. Your caregiver may want you to have the following immunizations before leaving the hospital:  Tetanus, diphtheria, and pertussis (Tdap) or tetanus and diphtheria (Td) immunization. It is very important that you and your family (including grandparents) or others caring for your newborn are up-to-date with the Tdap or Td immunizations. The Tdap or Td immunization can help protect your newborn  from getting ill.  Rubella immunization.  Varicella (chickenpox) immunization.  Influenza immunization. You should receive this annual immunization if you did not receive the immunization during your pregnancy. Document Released: 04/22/2012 Document Reviewed: 04/22/2012 [facility] Patient Information 2015 [place], [place]. This information is not intended to replace advice given to you by your health care provider. Make sure you discuss any questions you have with your health care provider.  Levonorgestrel intrauterine device (IUD) What is this medicine? LEVONORGESTREL IUD (LEE voe nor jes trel) is a contraceptive (birth control) device. The device is placed inside the uterus by a healthcare professional. It is used to prevent pregnancy and can also be used to treat heavy bleeding that occurs during your period. Depending on the device, it can be used for 3 to 5 years. This medicine may be used for other purposes; ask your health care provider or pharmacist if you have questions. COMMON BRAND NAME(S): Elveria Royals What should I tell my health care provider before I take this medicine? They need to know if you have any of these conditions: -abnormal Pap smear -cancer of the breast, uterus, or cervix -diabetes -endometritis -genital or pelvic infection now or in the past -have more than one sexual partner or your partner has more than one partner -heart disease -history of an ectopic or tubal pregnancy -immune system problems -IUD in place -liver disease or tumor -problems with blood clots or take blood-thinners -use intravenous drugs -uterus of unusual shape -vaginal bleeding that has not been explained -an unusual or allergic reaction to levonorgestrel, other hormones, silicone, or polyethylene, medicines, foods, dyes, or preservatives -pregnant or trying to get pregnant -breast-feeding How should I use this medicine? This device is placed inside the uterus by a health care  professional. Talk to your pediatrician regarding the use of this medicine in children. Special care may be needed. Overdosage: If you think you have taken too much of this medicine contact a poison control center or emergency room at once. NOTE: This medicine is only for you. Do not share this medicine with others. What if I miss a dose? This does not apply. What may interact with this medicine? Do not take this medicine with any of the following medications: -amprenavir -bosentan -fosamprenavir This medicine may also interact with the following medications: -aprepitant -barbiturate medicines for inducing sleep or treating seizures -bexarotene -griseofulvin -medicines to treat seizures like carbamazepine, ethotoin, felbamate, oxcarbazepine, phenytoin, topiramate -modafinil -pioglitazone -rifabutin -rifampin -rifapentine -some medicines to treat HIV infection like atazanavir, indinavir, lopinavir, nelfinavir, tipranavir, ritonavir -St. John's wort -warfarin This list may not describe all possible interactions. Give your health care provider a list of all the medicines, herbs, non-prescription drugs, or dietary supplements you use. Also tell them if you smoke, drink alcohol, or use illegal drugs. Some items may interact with your medicine. What should I watch for while using this medicine? Visit your doctor or health care professional for regular check ups. See your doctor if you or your partner has sexual contact  with others, becomes HIV positive, or gets a sexual transmitted disease. This product does not protect you against HIV infection (AIDS) or other sexually transmitted diseases. You can check the placement of the IUD yourself by reaching up to the top of your vagina with clean fingers to feel the threads. Do not pull on the threads. It is a good habit to check placement after each menstrual period. Call your doctor right away if you feel more of the IUD than just the threads or if  you cannot feel the threads at all. The IUD may come out by itself. You may become pregnant if the device comes out. If you notice that the IUD has come out use a backup birth control method like condoms and call your health care provider. Using tampons will not change the position of the IUD and are okay to use during your period. What side effects may I notice from receiving this medicine? Side effects that you should report to your doctor or health care professional as soon as possible: -allergic reactions like skin rash, itching or hives, swelling of the face, lips, or tongue -fever, flu-like symptoms -genital sores -high blood pressure -no menstrual period for 6 weeks during use -pain, swelling, warmth in the leg -pelvic pain or tenderness -severe or sudden headache -signs of pregnancy -stomach cramping -sudden shortness of breath -trouble with balance, talking, or walking -unusual vaginal bleeding, discharge -yellowing of the eyes or skin Side effects that usually do not require medical attention (report to your doctor or health care professional if they continue or are bothersome): -acne -breast pain -change in sex drive or performance -changes in weight -cramping, dizziness, or faintness while the device is being inserted -headache -irregular menstrual bleeding within first 3 to 6 months of use -nausea This list may not describe all possible side effects. Call your doctor for medical advice about side effects. You may report side effects to FDA at 1-800-FDA-1088. Where should I keep my medicine? This does not apply. NOTE: This sheet is a summary. It may not cover all possible information. If you have questions about this medicine, talk to your doctor, pharmacist, or health care provider.  2015, [employer]vier/Gold Standard. (2011-08-29 13:54:04)

## 2014-06-13 ENCOUNTER — Encounter (HOSPITAL_COMMUNITY): Payer: Self-pay | Admitting: Obstetrics and Gynecology

## 2014-09-25 ENCOUNTER — Emergency Department (HOSPITAL_BASED_OUTPATIENT_CLINIC_OR_DEPARTMENT_OTHER): Payer: Managed Care, Other (non HMO)

## 2014-09-25 ENCOUNTER — Emergency Department (HOSPITAL_BASED_OUTPATIENT_CLINIC_OR_DEPARTMENT_OTHER)
Admission: EM | Admit: 2014-09-25 | Discharge: 2014-09-25 | Disposition: A | Discharge status: Home / Self Care | Payer: Managed Care, Other (non HMO) | Attending: Emergency Medicine | Admitting: Emergency Medicine

## 2014-09-25 ENCOUNTER — Encounter (HOSPITAL_BASED_OUTPATIENT_CLINIC_OR_DEPARTMENT_OTHER): Payer: Self-pay

## 2014-09-25 DIAGNOSIS — J159 Unspecified bacterial pneumonia: Secondary | ICD-10-CM | POA: Diagnosis not present

## 2014-09-25 DIAGNOSIS — J189 Pneumonia, unspecified organism: Secondary | ICD-10-CM

## 2014-09-25 DIAGNOSIS — Z8742 Personal history of other diseases of the female genital tract: Secondary | ICD-10-CM | POA: Diagnosis not present

## 2014-09-25 DIAGNOSIS — Z79899 Other long term (current) drug therapy: Secondary | ICD-10-CM | POA: Diagnosis not present

## 2014-09-25 DIAGNOSIS — R05 Cough: Secondary | ICD-10-CM | POA: Diagnosis present

## 2014-09-25 MED ORDER — AZITHROMYCIN 250 MG PO TABS: ORAL_TABLET | ORAL | Status: DC

## 2014-09-25 MED ORDER — AZITHROMYCIN 250 MG PO TABS
500.0000 mg | ORAL_TABLET | Freq: Once | ORAL | Status: AC
  Administered 2014-09-25: 500 mg via ORAL
  Filled 2014-09-25: qty 2

## 2014-09-25 MED ORDER — ACETAMINOPHEN 325 MG PO TABS
650.0000 mg | ORAL_TABLET | Freq: Once | ORAL | Status: AC
  Administered 2014-09-25: 650 mg via ORAL
  Filled 2014-09-25: qty 2

## 2014-09-25 NOTE — ED Provider Notes (Signed)
CSN: [id number]     Arrival date & time 09/25/14  1315 History   First MD Initiated Contact with Patient 09/25/14 1332     Chief Complaint  Patient presents with  . Cough     Patient is a 24 y.o. female presenting with cough. The history is provided by the patient.  Cough Cough characteristics:  Productive Sputum characteristics:  Yellow Severity:  Moderate Onset quality:  Gradual Duration:  4 days Timing:  Intermittent Progression:  Worsening Chronicity:  New Smoker: no   Relieved by:  Nothing Worsened by:  Nothing tried Associated symptoms: chills, fever, myalgias and shortness of breath   Associated symptoms: no chest pain   Patient presents with cough/congestion for 4 days She feels it is worsening No CP She does report mild SOB  She reports fever at home  She is currently breastfeedings She has no other medical conditions  Past Medical History  Diagnosis Date  . H/O dysmenorrhea    Past Surgical History  Procedure Laterality Date  . Wisdom tooth extraction    . Cesarean section N/A 04/29/2014    Procedure: CESAREAN SECTION;  Surgeon: Esmeralda Arthur, MD;  Location: [facility];  Service: Obstetrics;  Laterality: N/A;   Family History  Problem Relation Age of Onset  . Cancer Paternal Grandmother     LYMPHOMA  . Hypertension Maternal Grandmother   . Diabetes Sister     GESTATIONAL  . Hypertension Maternal Uncle    History  Substance Use Topics  . Smoking status: Never Smoker   . Smokeless tobacco: Never Used  . Alcohol Use: No   OB History    Gravida Para Term Preterm AB TAB SAB Ectopic Multiple Living   Review of Systems  Constitutional: Positive for fever and chills.  Respiratory: Positive for cough and shortness of breath.   Cardiovascular: Negative for chest pain.  Gastrointestinal: Negative for vomiting.  Musculoskeletal: Positive for myalgias.  Neurological: Negative for syncope.  All other systems reviewed and are  negative.     Allergies  Review of patient's allergies indicates no known allergies.  Home Medications   Prior to Admission medications   Medication Sig Start Date End Date Taking? Authorizing Provider  azithromycin (ZITHROMAX) 250 MG tablet 1 PO every day until finished. 09/25/14   Joya Gaskins, MD  Doxylamine-Pyridoxine (DICLEGIS) 10-10 MG TBEC Take 1 tablet by mouth daily.    Historical Provider, MD  ferrous sulfate 325 (65 FE) MG tablet Take 325 mg by mouth daily with breakfast.    Historical Provider, MD  ibuprofen (ADVIL,MOTRIN) 600 MG tablet Take 1 tablet (600 mg total) by mouth every 6 (six) hours as needed. 05/02/14   Nigel Bridgeman, CNM  Prenatal Vit-Fe Fumarate-FA (PRENATAL MULTIVITAMIN) TABS tablet Take 1 tablet by mouth daily at 12 noon.    Historical Provider, MD   BP 105/66 mmHg  Pulse 92  Temp(Src) 100 F (37.8 C) (Oral)  Resp 18  Ht  (1.549 m)  Wt 202 lb 14.4 oz (92.035 kg)  BMI 38.36 kg/m2  SpO2 100%  LMP 09/21/2014  Breastfeeding? Yes Physical Exam CONSTITUTIONAL: Well developed/well nourished HEAD: Normocephalic/atraumatic EYES: EOMI/PERRL ENMT: Mucous membranes moist, nasal congestion NECK: supple no meningeal signs SPINE/BACK:entire spine nontender CV: S1/S2 noted, no murmurs/rubs/gallops noted LUNGS: Lungs are clear to auscultation bilaterally, no apparent distress ABDOMEN: soft, nontender, no rebound or guarding, bowel sounds noted throughout abdomen GU:no cva  tenderness NEURO: Pt is awake/alert/appropriate, moves all extremitiesx4.  No facial droop.   EXTREMITIES: pulses normal/equal, full ROM, no LE edema noted, no calf tenderness noted SKIN: warm, color normal PSYCH: no abnormalities of mood noted, alert and oriented to situation  ED Course  Procedures  Pt well appearing She had evidence of pneumonia on CXR but lung sounds clear She is in no distress and no hypoxia I doubt PE/CHF as cause of symptoms.  Given fever/cough/congestion  likely pneumonia Felt azithromycin appropriate given age and that she is breasfeeding We discussed strict return precautions  Imaging Review Dg Chest 2 View  09/25/2014   CLINICAL DATA:  Cough and fever for 5 days.  Shortness of breath.  EXAM: CHEST  2 VIEW  COMPARISON:  None.  FINDINGS: Pulmonary airspace disease is seen in the right lower lobe, consistent with pneumonia. Left lung is clear. No evidence of pleural effusion. Heart size and mediastinal contours are within normal limits.  IMPRESSION: Right lower lobe airspace disease, consistent with pneumonia.   Electronically Signed   By: Myles RosenthalJohn  Stahl M.D.   On: 09/25/2014 13:56      MDM   Final diagnoses:  Community acquired pneumonia    Nursing notes including past medical history and social history reviewed and considered in documentation xrays/imaging reviewed by myself and considered during evaluation     Joya Gaskinsonald W Zoua Caporaso, MD 09/25/14 1546

## 2014-09-25 NOTE — ED Notes (Signed)
Pt reports cough and fever since Wednesday reports fever as high as 103.  Pt reports she is breastfeeding and now having body aches and feeling dry.  Pt reports sputum is yellow.

## 2016-01-20 IMAGING — DX DG CHEST 2V
2 series · 2 of 2 positions shown · non-contrast
Comparison: None.

CLINICAL DATA: Cough and fever for 5 days.  Shortness of breath.

EXAM:
CHEST  2 VIEW

[chest pa]
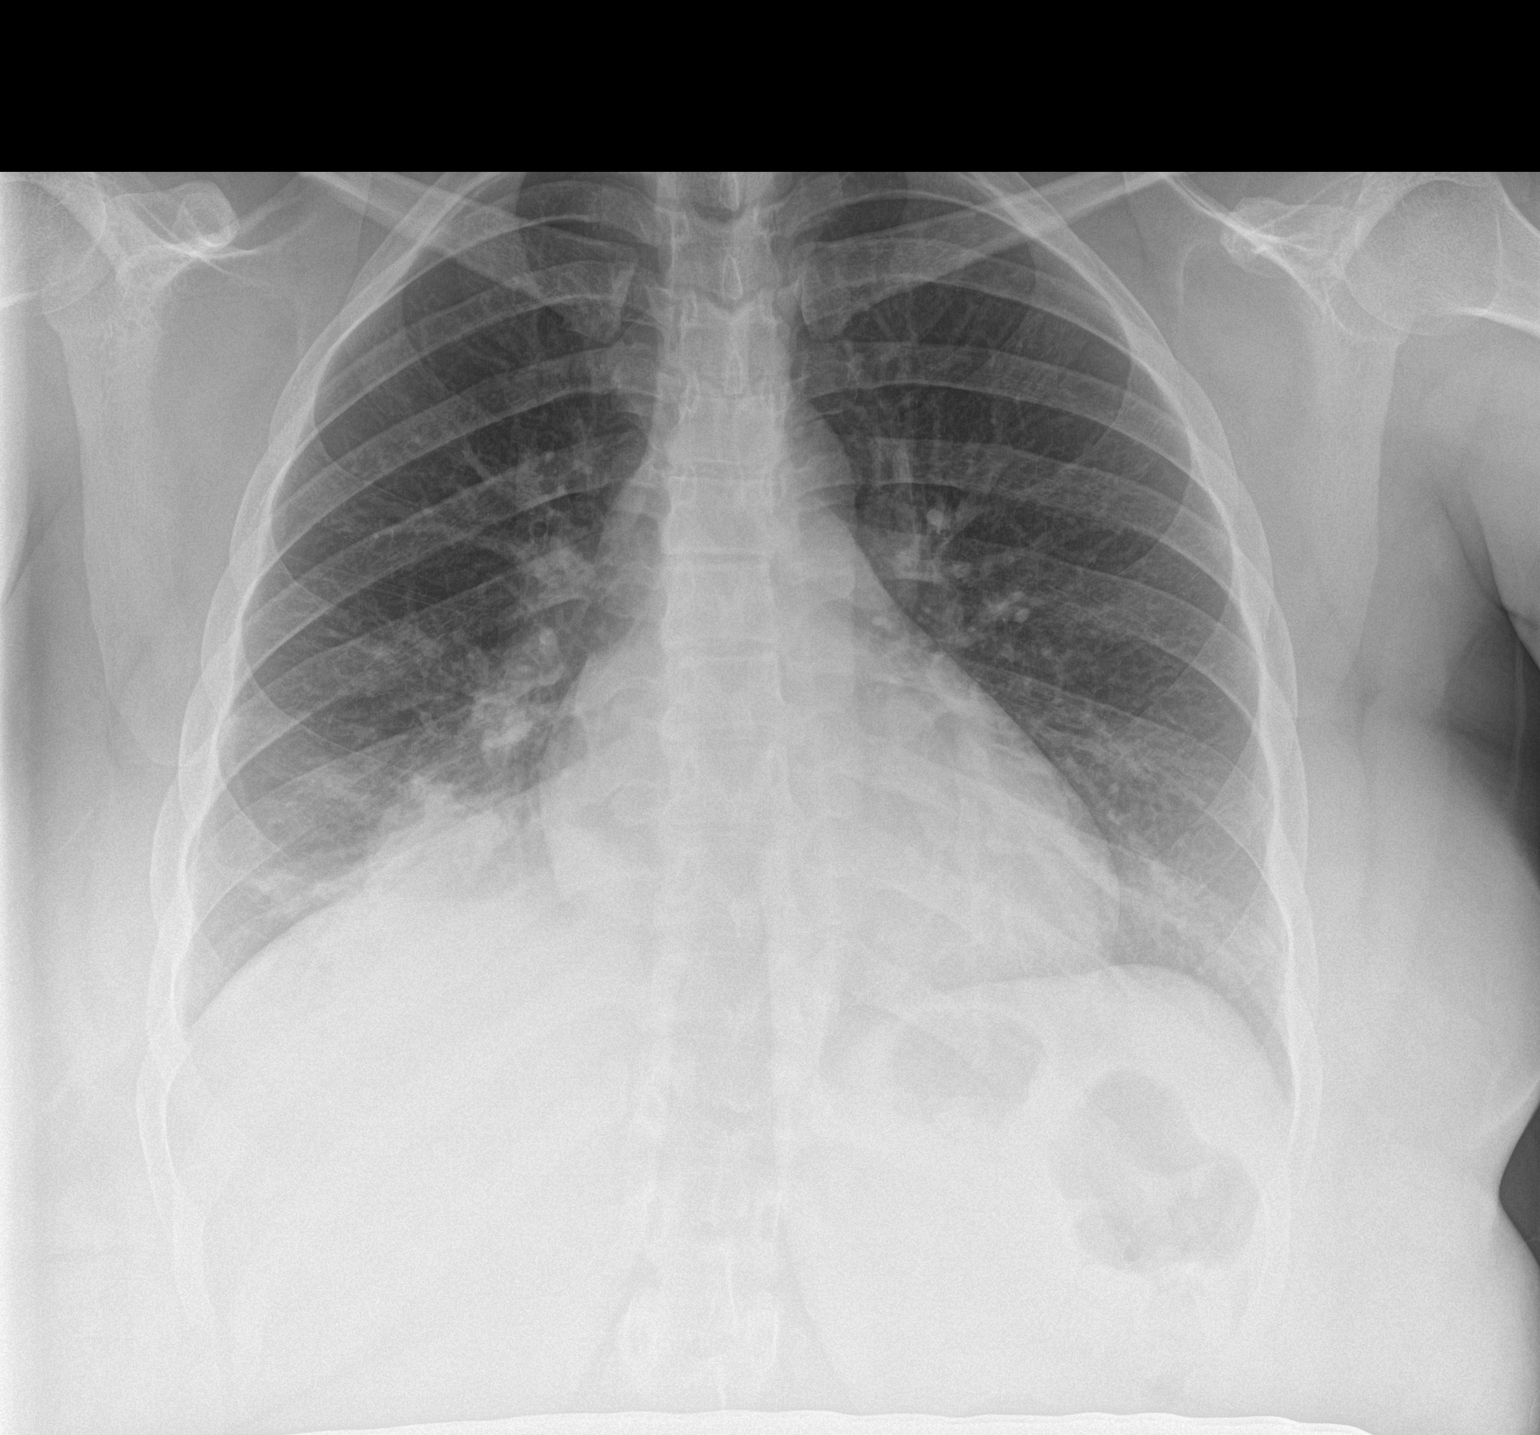

[chest lat]
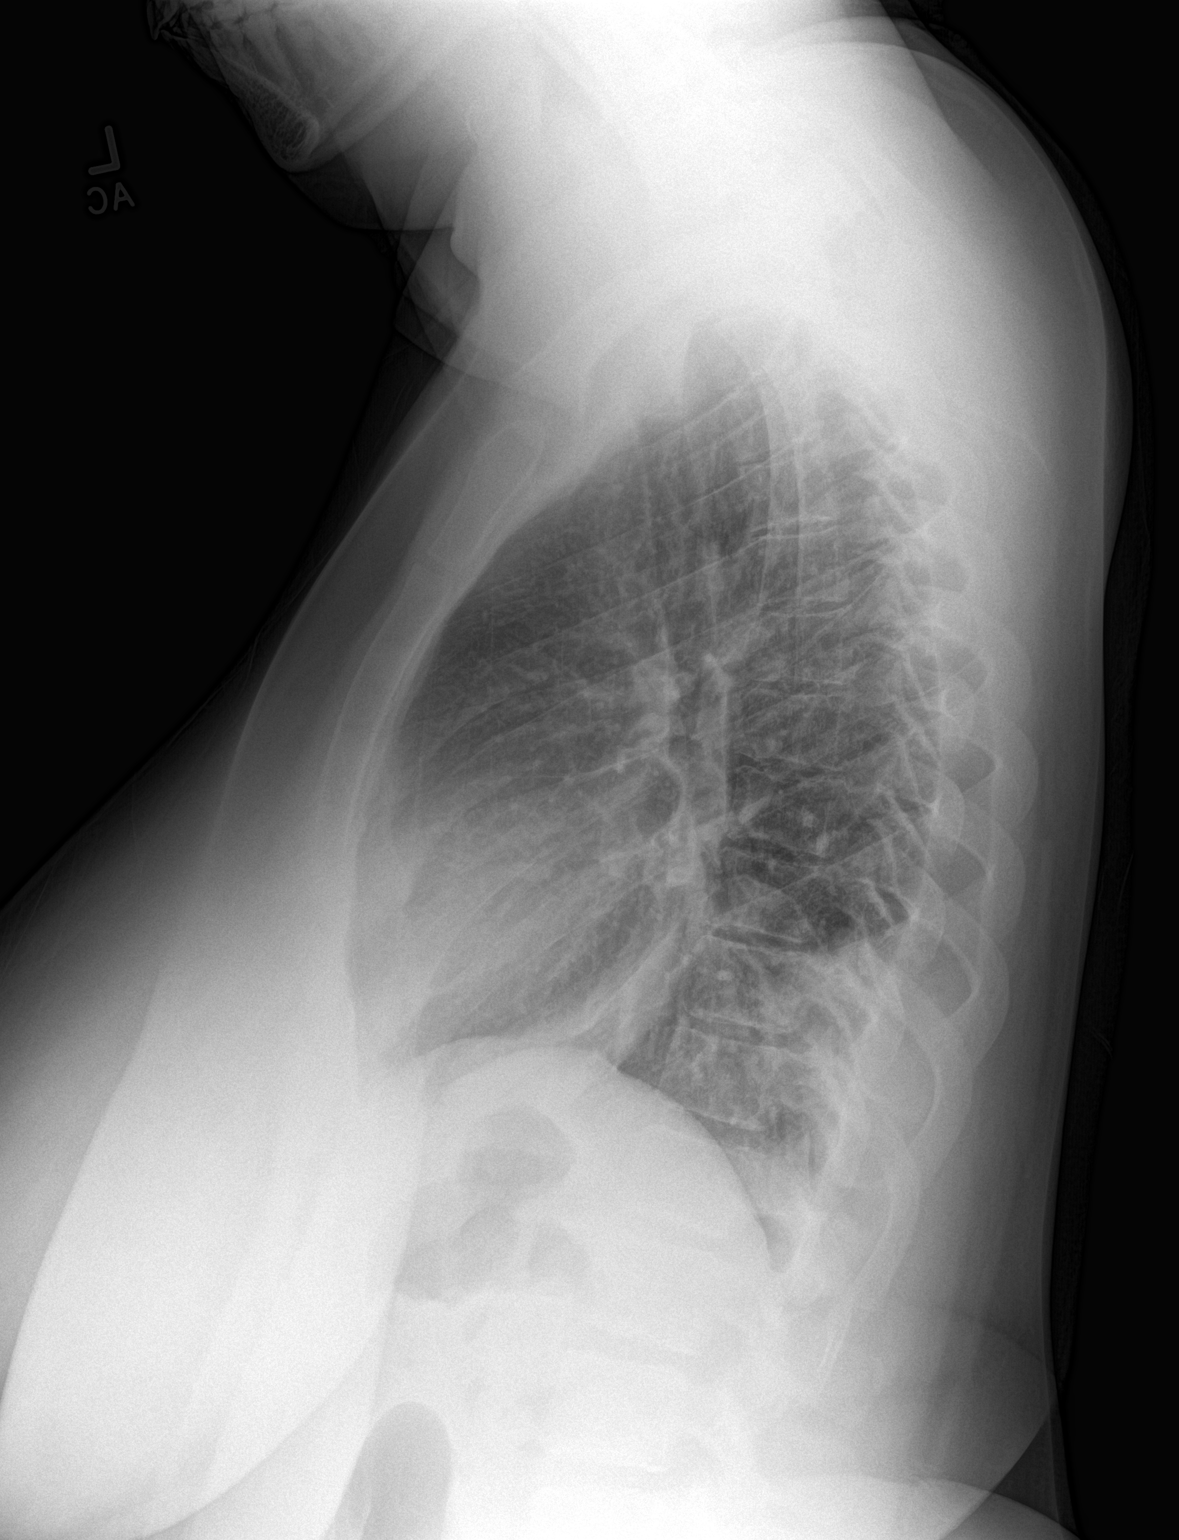

[2 of 2 positions shown; findings below may reference images not displayed]

FINDINGS: Pulmonary airspace disease is seen in the right lower lobe,
consistent with pneumonia. Left lung is clear. No evidence of
pleural effusion. Heart size and mediastinal contours are within
normal limits.
IMPRESSION: Right lower lobe airspace disease, consistent with pneumonia.

## 2016-10-28 LAB — OB RESULTS CONSOLE HEPATITIS B SURFACE ANTIGEN: HEP B S AG: NEGATIVE

## 2016-10-28 LAB — OB RESULTS CONSOLE RPR: RPR: NONREACTIVE

## 2016-10-28 LAB — OB RESULTS CONSOLE ABO/RH: RH TYPE: POSITIVE

## 2016-10-28 LAB — OB RESULTS CONSOLE GC/CHLAMYDIA
CHLAMYDIA, DNA PROBE: NEGATIVE
Gonorrhea: NEGATIVE

## 2016-10-28 LAB — OB RESULTS CONSOLE HIV ANTIBODY (ROUTINE TESTING): HIV: NONREACTIVE

## 2016-10-28 LAB — OB RESULTS CONSOLE RUBELLA ANTIBODY, IGM: RUBELLA: IMMUNE

## 2016-10-28 LAB — OB RESULTS CONSOLE ANTIBODY SCREEN: Antibody Screen: NEGATIVE

## 2017-06-04 LAB — OB RESULTS CONSOLE GBS: STREP GROUP B AG: NEGATIVE

## 2017-06-20 ENCOUNTER — Telehealth (HOSPITAL_COMMUNITY): Payer: Self-pay | Admitting: *Deleted

## 2017-06-20 ENCOUNTER — Encounter (HOSPITAL_COMMUNITY): Payer: Self-pay | Admitting: *Deleted

## 2017-06-20 NOTE — Telephone Encounter (Signed)
Preadmission screen  

## 2017-06-24 ENCOUNTER — Inpatient Hospital Stay (HOSPITAL_COMMUNITY): Payer: Managed Care, Other (non HMO) | Admitting: Anesthesiology

## 2017-06-24 ENCOUNTER — Encounter (HOSPITAL_COMMUNITY)
Admission: AD | Disposition: A | Discharge status: Home / Self Care | Payer: Self-pay | Source: Ambulatory Visit | Attending: Obstetrics and Gynecology

## 2017-06-24 ENCOUNTER — Encounter (HOSPITAL_COMMUNITY): Payer: Self-pay | Admitting: Certified Registered Nurse Anesthetist

## 2017-06-24 ENCOUNTER — Inpatient Hospital Stay (HOSPITAL_COMMUNITY)
Admission: AD | Admit: 2017-06-24 | Discharge: 2017-06-26 | DRG: 788 | Disposition: A | Discharge status: Home / Self Care | Payer: Managed Care, Other (non HMO) | Source: Ambulatory Visit | Attending: Obstetrics and Gynecology | Admitting: Obstetrics and Gynecology

## 2017-06-24 ENCOUNTER — Encounter (HOSPITAL_COMMUNITY): Payer: Self-pay | Admitting: *Deleted

## 2017-06-24 DIAGNOSIS — O34211 Maternal care for low transverse scar from previous cesarean delivery: Principal | ICD-10-CM | POA: Diagnosis present

## 2017-06-24 DIAGNOSIS — D649 Anemia, unspecified: Secondary | ICD-10-CM | POA: Diagnosis present

## 2017-06-24 DIAGNOSIS — O9902 Anemia complicating childbirth: Secondary | ICD-10-CM | POA: Diagnosis present

## 2017-06-24 DIAGNOSIS — O99214 Obesity complicating childbirth: Secondary | ICD-10-CM | POA: Diagnosis present

## 2017-06-24 DIAGNOSIS — O328XX0 Maternal care for other malpresentation of fetus, not applicable or unspecified: Secondary | ICD-10-CM | POA: Diagnosis present

## 2017-06-24 DIAGNOSIS — Z3A40 40 weeks gestation of pregnancy: Secondary | ICD-10-CM | POA: Diagnosis not present

## 2017-06-24 DIAGNOSIS — O3663X0 Maternal care for excessive fetal growth, third trimester, not applicable or unspecified: Secondary | ICD-10-CM | POA: Diagnosis present

## 2017-06-24 LAB — CBC
HCT: 31.2 % — ABNORMAL LOW (ref 36.0–46.0)
Hemoglobin: 9.8 g/dL — ABNORMAL LOW (ref 12.0–15.0)
MCH: 23.7 pg — AB (ref 26.0–34.0)
MCHC: 31.4 g/dL (ref 30.0–36.0)
MCV: 75.5 fL — AB (ref 78.0–100.0)
PLATELETS: 225 10*3/uL (ref 150–400)
RBC: 4.13 MIL/uL (ref 3.87–5.11)
RDW: 19 % — AB (ref 11.5–15.5)
WBC: 7.3 10*3/uL (ref 4.0–10.5)

## 2017-06-24 LAB — RPR: RPR Ser Ql: NONREACTIVE

## 2017-06-24 LAB — TYPE AND SCREEN
ABO/RH(D): A POS
Antibody Screen: NEGATIVE

## 2017-06-24 SURGERY — Surgical Case
Anesthesia: Spinal | Wound class: Clean Contaminated

## 2017-06-24 SURGERY — Surgical Case
Anesthesia: Regional

## 2017-06-24 MED ORDER — LIDOCAINE HCL (PF) 1 % IJ SOLN: 30.0000 mL | INTRAMUSCULAR | Status: DC | PRN

## 2017-06-24 MED ORDER — LACTATED RINGERS IV SOLN: 500.0000 mL | INTRAVENOUS | Status: DC | PRN

## 2017-06-24 MED ORDER — COCONUT OIL OIL: 1.0000 "application " | TOPICAL_OIL | Status: DC | PRN

## 2017-06-24 MED ORDER — SIMETHICONE 80 MG PO CHEW
80.0000 mg | CHEWABLE_TABLET | ORAL | Status: DC
  Administered 2017-06-24 – 2017-06-26 (×2): 80 mg via ORAL
  Filled 2017-06-24 (×2): qty 1

## 2017-06-24 MED ORDER — METHYLERGONOVINE MALEATE 0.2 MG/ML IJ SOLN: 0.2000 mg | INTRAMUSCULAR | Status: DC | PRN

## 2017-06-24 MED ORDER — NALBUPHINE HCL 10 MG/ML IJ SOLN: 5.0000 mg | Freq: Once | INTRAMUSCULAR | Status: DC | PRN

## 2017-06-24 MED ORDER — SOD CITRATE-CITRIC ACID 500-334 MG/5ML PO SOLN
30.0000 mL | ORAL | Status: AC
  Administered 2017-06-24: 30 mL via ORAL
  Filled 2017-06-24: qty 15

## 2017-06-24 MED ORDER — LACTATED RINGERS IV SOLN
INTRAVENOUS | Status: DC | PRN
  Administered 2017-06-24: 40 [IU] via INTRAVENOUS

## 2017-06-24 MED ORDER — FENTANYL CITRATE (PF) 100 MCG/2ML IJ SOLN
INTRAMUSCULAR | Status: AC
  Filled 2017-06-24: qty 2

## 2017-06-24 MED ORDER — LACTATED RINGERS IV SOLN
INTRAVENOUS | Status: DC | PRN
  Administered 2017-06-24: 10:00:00 via INTRAVENOUS

## 2017-06-24 MED ORDER — SCOPOLAMINE 1 MG/3DAYS TD PT72
1.0000 | MEDICATED_PATCH | Freq: Once | TRANSDERMAL | Status: DC
  Filled 2017-06-24: qty 1

## 2017-06-24 MED ORDER — OXYCODONE-ACETAMINOPHEN 5-325 MG PO TABS: 1.0000 | ORAL_TABLET | ORAL | Status: DC | PRN

## 2017-06-24 MED ORDER — DIPHENHYDRAMINE HCL 25 MG PO CAPS: 25.0000 mg | ORAL_CAPSULE | Freq: Four times a day (QID) | ORAL | Status: DC | PRN

## 2017-06-24 MED ORDER — WITCH HAZEL-GLYCERIN EX PADS: 1.0000 "application " | MEDICATED_PAD | CUTANEOUS | Status: DC | PRN

## 2017-06-24 MED ORDER — SIMETHICONE 80 MG PO CHEW
80.0000 mg | CHEWABLE_TABLET | Freq: Three times a day (TID) | ORAL | Status: DC
  Administered 2017-06-24 – 2017-06-26 (×5): 80 mg via ORAL
  Filled 2017-06-24 (×5): qty 1

## 2017-06-24 MED ORDER — FENTANYL CITRATE (PF) 100 MCG/2ML IJ SOLN: 50.0000 ug | INTRAMUSCULAR | Status: DC | PRN

## 2017-06-24 MED ORDER — ZOLPIDEM TARTRATE 5 MG PO TABS: 5.0000 mg | ORAL_TABLET | Freq: Every evening | ORAL | Status: DC | PRN

## 2017-06-24 MED ORDER — DIPHENHYDRAMINE HCL 25 MG PO CAPS: 25.0000 mg | ORAL_CAPSULE | ORAL | Status: DC | PRN

## 2017-06-24 MED ORDER — PHENYLEPHRINE HCL 10 MG/ML IJ SOLN
INTRAMUSCULAR | Status: DC | PRN
  Administered 2017-06-24: 80 ug via INTRAVENOUS

## 2017-06-24 MED ORDER — MORPHINE SULFATE (PF) 0.5 MG/ML IJ SOLN
INTRAMUSCULAR | Status: AC
  Filled 2017-06-24: qty 10

## 2017-06-24 MED ORDER — OXYCODONE-ACETAMINOPHEN 5-325 MG PO TABS: 2.0000 | ORAL_TABLET | ORAL | Status: DC | PRN

## 2017-06-24 MED ORDER — OXYCODONE-ACETAMINOPHEN 5-325 MG PO TABS
1.0000 | ORAL_TABLET | ORAL | Status: DC | PRN
  Administered 2017-06-25 – 2017-06-26 (×2): 1 via ORAL
  Filled 2017-06-24 (×2): qty 1

## 2017-06-24 MED ORDER — CEFAZOLIN SODIUM-DEXTROSE 2-4 GM/100ML-% IV SOLN: 2.0000 g | INTRAVENOUS | Status: DC

## 2017-06-24 MED ORDER — ACETAMINOPHEN 325 MG PO TABS: 650.0000 mg | ORAL_TABLET | ORAL | Status: DC | PRN

## 2017-06-24 MED ORDER — PROMETHAZINE HCL 25 MG/ML IJ SOLN: 6.2500 mg | INTRAMUSCULAR | Status: DC | PRN

## 2017-06-24 MED ORDER — SENNOSIDES-DOCUSATE SODIUM 8.6-50 MG PO TABS
2.0000 | ORAL_TABLET | ORAL | Status: DC
  Administered 2017-06-24 – 2017-06-26 (×2): 2 via ORAL
  Filled 2017-06-24 (×2): qty 2

## 2017-06-24 MED ORDER — OXYTOCIN 40 UNITS IN LACTATED RINGERS INFUSION - SIMPLE MED: 2.5000 [IU]/h | INTRAVENOUS | Status: DC

## 2017-06-24 MED ORDER — BUPIVACAINE IN DEXTROSE 0.75-8.25 % IT SOLN
INTRATHECAL | Status: AC
  Filled 2017-06-24: qty 2

## 2017-06-24 MED ORDER — HYDROMORPHONE HCL 1 MG/ML IJ SOLN: 0.2500 mg | INTRAMUSCULAR | Status: DC | PRN

## 2017-06-24 MED ORDER — ONDANSETRON HCL 4 MG/2ML IJ SOLN: 4.0000 mg | Freq: Four times a day (QID) | INTRAMUSCULAR | Status: DC | PRN

## 2017-06-24 MED ORDER — SIMETHICONE 80 MG PO CHEW: 80.0000 mg | CHEWABLE_TABLET | ORAL | Status: DC | PRN

## 2017-06-24 MED ORDER — FLEET ENEMA 7-19 GM/118ML RE ENEM: 1.0000 | ENEMA | RECTAL | Status: DC | PRN

## 2017-06-24 MED ORDER — CEFAZOLIN SODIUM-DEXTROSE 2-3 GM-%(50ML) IV SOLR
INTRAVENOUS | Status: DC | PRN
  Administered 2017-06-24: 2 g via INTRAVENOUS

## 2017-06-24 MED ORDER — PRENATAL MULTIVITAMIN CH
1.0000 | ORAL_TABLET | Freq: Every day | ORAL | Status: DC
  Administered 2017-06-25: 1 via ORAL
  Filled 2017-06-24: qty 1

## 2017-06-24 MED ORDER — TETANUS-DIPHTH-ACELL PERTUSSIS 5-2.5-18.5 LF-MCG/0.5 IM SUSP: 0.5000 mL | Freq: Once | INTRAMUSCULAR | Status: DC

## 2017-06-24 MED ORDER — NALOXONE HCL 2 MG/2ML IJ SOSY
1.0000 ug/kg/h | PREFILLED_SYRINGE | INTRAVENOUS | Status: DC | PRN
  Filled 2017-06-24: qty 2

## 2017-06-24 MED ORDER — ONDANSETRON HCL 4 MG/2ML IJ SOLN
INTRAMUSCULAR | Status: AC
  Filled 2017-06-24: qty 2

## 2017-06-24 MED ORDER — ONDANSETRON HCL 4 MG/2ML IJ SOLN: 4.0000 mg | Freq: Three times a day (TID) | INTRAMUSCULAR | Status: DC | PRN

## 2017-06-24 MED ORDER — MORPHINE SULFATE (PF) 0.5 MG/ML IJ SOLN
INTRAMUSCULAR | Status: DC | PRN
Start: 2017-06-24 — End: 2017-06-24
  Administered 2017-06-24: .2 mg via EPIDURAL

## 2017-06-24 MED ORDER — MENTHOL 3 MG MT LOZG: 1.0000 | LOZENGE | OROMUCOSAL | Status: DC | PRN

## 2017-06-24 MED ORDER — METHYLERGONOVINE MALEATE 0.2 MG PO TABS: 0.2000 mg | ORAL_TABLET | ORAL | Status: DC | PRN

## 2017-06-24 MED ORDER — BUPIVACAINE IN DEXTROSE 0.75-8.25 % IT SOLN
INTRATHECAL | Status: DC | PRN
  Administered 2017-06-24: 1.6 mg via INTRATHECAL

## 2017-06-24 MED ORDER — OXYTOCIN BOLUS FROM INFUSION: 500.0000 mL | Freq: Once | INTRAVENOUS | Status: DC

## 2017-06-24 MED ORDER — FENTANYL CITRATE (PF) 100 MCG/2ML IJ SOLN
INTRAMUSCULAR | Status: DC | PRN
  Administered 2017-06-24: 10 ug via INTRATHECAL

## 2017-06-24 MED ORDER — SOD CITRATE-CITRIC ACID 500-334 MG/5ML PO SOLN: 30.0000 mL | ORAL | Status: DC | PRN

## 2017-06-24 MED ORDER — ONDANSETRON HCL 4 MG/2ML IJ SOLN
INTRAMUSCULAR | Status: DC | PRN
  Administered 2017-06-24: 4 mg via INTRAVENOUS

## 2017-06-24 MED ORDER — OXYTOCIN 10 UNIT/ML IJ SOLN
INTRAMUSCULAR | Status: AC
  Filled 2017-06-24: qty 4

## 2017-06-24 MED ORDER — CEFAZOLIN SODIUM-DEXTROSE 2-3 GM-%(50ML) IV SOLR
INTRAVENOUS | Status: AC
  Filled 2017-06-24: qty 50

## 2017-06-24 MED ORDER — OXYCODONE HCL 5 MG/5ML PO SOLN: 5.0000 mg | Freq: Once | ORAL | Status: DC | PRN

## 2017-06-24 MED ORDER — DIPHENHYDRAMINE HCL 50 MG/ML IJ SOLN: 12.5000 mg | INTRAMUSCULAR | Status: DC | PRN

## 2017-06-24 MED ORDER — PHENYLEPHRINE 40 MCG/ML (10ML) SYRINGE FOR IV PUSH (FOR BLOOD PRESSURE SUPPORT)
PREFILLED_SYRINGE | INTRAVENOUS | Status: AC
  Filled 2017-06-24: qty 10

## 2017-06-24 MED ORDER — NALOXONE HCL 0.4 MG/ML IJ SOLN: 0.4000 mg | INTRAMUSCULAR | Status: DC | PRN

## 2017-06-24 MED ORDER — LACTATED RINGERS IV SOLN
INTRAVENOUS | Status: DC
  Administered 2017-06-24 (×4): via INTRAVENOUS

## 2017-06-24 MED ORDER — POLYSACCHARIDE IRON COMPLEX 150 MG PO CAPS
150.0000 mg | ORAL_CAPSULE | Freq: Every day | ORAL | Status: DC
  Administered 2017-06-25: 150 mg via ORAL
  Filled 2017-06-24: qty 1

## 2017-06-24 MED ORDER — NALBUPHINE HCL 10 MG/ML IJ SOLN: 5.0000 mg | INTRAMUSCULAR | Status: DC | PRN

## 2017-06-24 MED ORDER — IBUPROFEN 600 MG PO TABS
600.0000 mg | ORAL_TABLET | Freq: Four times a day (QID) | ORAL | Status: DC
  Administered 2017-06-24 – 2017-06-26 (×7): 600 mg via ORAL
  Filled 2017-06-24 (×7): qty 1

## 2017-06-24 MED ORDER — PHENYLEPHRINE 8 MG IN D5W 100 ML (0.08MG/ML) PREMIX OPTIME
INJECTION | INTRAVENOUS | Status: AC
  Filled 2017-06-24: qty 100

## 2017-06-24 MED ORDER — DIBUCAINE 1 % RE OINT: 1.0000 "application " | TOPICAL_OINTMENT | RECTAL | Status: DC | PRN

## 2017-06-24 MED ORDER — SODIUM CHLORIDE 0.9% FLUSH: 3.0000 mL | INTRAVENOUS | Status: DC | PRN

## 2017-06-24 MED ORDER — LACTATED RINGERS IV SOLN
INTRAVENOUS | Status: DC
  Administered 2017-06-25: via INTRAVENOUS

## 2017-06-24 MED ORDER — OXYTOCIN 40 UNITS IN LACTATED RINGERS INFUSION - SIMPLE MED: 2.5000 [IU]/h | INTRAVENOUS | Status: AC

## 2017-06-24 MED ORDER — OXYCODONE HCL 5 MG PO TABS: 5.0000 mg | ORAL_TABLET | Freq: Once | ORAL | Status: DC | PRN

## 2017-06-24 MED ORDER — PHENYLEPHRINE 8 MG IN D5W 100 ML (0.08MG/ML) PREMIX OPTIME
INJECTION | INTRAVENOUS | Status: DC | PRN
  Administered 2017-06-24: 60 ug/min via INTRAVENOUS

## 2017-06-24 SURGICAL SUPPLY — 37 items
BARRIER ADHS 3X4 INTERCEED (GAUZE/BANDAGES/DRESSINGS) ×6 IMPLANT
BENZOIN TINCTURE PRP APPL 2/3 (GAUZE/BANDAGES/DRESSINGS) ×3 IMPLANT
CHLORAPREP W/TINT 26ML (MISCELLANEOUS) ×3 IMPLANT
CLAMP CORD UMBIL (MISCELLANEOUS) IMPLANT
CLOSURE WOUND 1/2 X4 (GAUZE/BANDAGES/DRESSINGS) ×1
CLOTH BEACON ORANGE TIMEOUT ST (SAFETY) ×3 IMPLANT
DERMABOND ADVANCED (GAUZE/BANDAGES/DRESSINGS)
DERMABOND ADVANCED .7 DNX12 (GAUZE/BANDAGES/DRESSINGS) IMPLANT
DRESSING DISP NPWT PICO 4X12 (MISCELLANEOUS) ×3 IMPLANT
DRSG OPSITE POSTOP 4X10 (GAUZE/BANDAGES/DRESSINGS) ×3 IMPLANT
ELECT REM PT RETURN 9FT ADLT (ELECTROSURGICAL) ×3
ELECTRODE REM PT RTRN 9FT ADLT (ELECTROSURGICAL) ×1 IMPLANT
EXTRACTOR VACUUM BELL STYLE (SUCTIONS) IMPLANT
GLOVE BIO SURGEON STRL SZ7 (GLOVE) ×3 IMPLANT
GLOVE BIOGEL PI IND STRL 7.0 (GLOVE) ×2 IMPLANT
GLOVE BIOGEL PI INDICATOR 7.0 (GLOVE) ×4
GOWN STRL REUS W/TWL LRG LVL3 (GOWN DISPOSABLE) ×6 IMPLANT
KIT ABG SYR 3ML LUER SLIP (SYRINGE) IMPLANT
NEEDLE HYPO 25X5/8 SAFETYGLIDE (NEEDLE) IMPLANT
NS IRRIG 1000ML POUR BTL (IV SOLUTION) ×3 IMPLANT
PACK C SECTION WH (CUSTOM PROCEDURE TRAY) ×3 IMPLANT
PAD OB MATERNITY 4.3X12.25 (PERSONAL CARE ITEMS) ×3 IMPLANT
PENCIL SMOKE EVAC W/HOLSTER (ELECTROSURGICAL) ×3 IMPLANT
RETRACTOR TRAXI PANNICULUS (MISCELLANEOUS) ×1 IMPLANT
RTRCTR C-SECT PINK 25CM LRG (MISCELLANEOUS) ×3 IMPLANT
STRIP CLOSURE SKIN 1/2X4 (GAUZE/BANDAGES/DRESSINGS) ×2 IMPLANT
SUT CHROMIC 0 CTX 36 (SUTURE) IMPLANT
SUT MON AB 4-0 PS1 27 (SUTURE) ×3 IMPLANT
SUT PLAIN 0 NONE (SUTURE) IMPLANT
SUT PLAIN 2 0 XLH (SUTURE) ×6 IMPLANT
SUT VIC AB 0 CTX 36 (SUTURE) ×10
SUT VIC AB 0 CTX36XBRD ANBCTRL (SUTURE) ×5 IMPLANT
SUT VIC AB 2-0 CT1 27 (SUTURE) ×2
SUT VIC AB 2-0 CT1 TAPERPNT 27 (SUTURE) ×1 IMPLANT
TOWEL OR 17X24 6PK STRL BLUE (TOWEL DISPOSABLE) ×3 IMPLANT
TRAXI PANNICULUS RETRACTOR (MISCELLANEOUS) ×2
TRAY FOLEY BAG SILVER LF 14FR (SET/KITS/TRAYS/PACK) IMPLANT

## 2017-06-24 NOTE — Anesthesia Pain Management Evaluation Note (Signed)
  CRNA Pain Management Visit Note  Patient: Tammy Cochran, 26 y.o., female  "Hello I am a member of the anesthesia team at [facility]. We have an anesthesia team available at all times to provide care throughout the hospital, including epidural management and anesthesia for C-section. I don't know your plan for the delivery whether it a natural birth, water birth, IV sedation, nitrous supplementation, doula or epidural, but we want to meet your pain goals."   1.Was your pain managed to your expectations on prior hospitalizations?   Yes   2.What is your expectation for pain management during this hospitalization?     Epidural  3.How can we help you reach that goal?   Record the patient's initial score and the patient's pain goal.   Pain: 4  Pain Goal: 7 [facility] wants you to be able to say your pain was always managed very well.  Laban EmperorMalinova,Omar Orrego Hristova 06/24/2017

## 2017-06-24 NOTE — Progress Notes (Signed)
In to discuss repeat c-section and confirm presentation and lie. Consent has been signed but R/B/A reviewed. Pt without questions. Bedside ultrasound shows head to maternal left, transverse lie. Reassuring fetal heart tones.

## 2017-06-24 NOTE — Anesthesia Preprocedure Evaluation (Signed)
Anesthesia Evaluation  Patient identified by MRN, date of birth, ID band Patient awake    Reviewed: Allergy & Precautions, H&P , NPO status , Patient's Chart, lab work & pertinent test results  Airway Mallampati: III  TM Distance: >3 FB Neck ROM: Full    Dental no notable dental hx. (+) Teeth Intact   Pulmonary neg pulmonary ROS,    Pulmonary exam normal breath sounds clear to auscultation       Cardiovascular negative cardio ROS Normal cardiovascular exam Rhythm:Regular Rate:Normal     Neuro/Psych negative neurological ROS  negative psych ROS   GI/Hepatic Neg liver ROS, GERD  ,  Endo/Other  Morbid obesity  Renal/GU negative Renal ROS  negative genitourinary   Musculoskeletal negative musculoskeletal ROS (+)   Abdominal (+) + obese,   Peds  Hematology  (+) anemia ,   Anesthesia Other Findings   Reproductive/Obstetrics (+) Pregnancy Transverse lie                             Anesthesia Physical  Anesthesia Plan  ASA: III  Anesthesia Plan: Spinal   Post-op Pain Management:    Induction:   PONV Risk Score and Plan: 2 and Treatment may vary due to age or medical condition  Airway Management Planned: Natural Airway  Additional Equipment:   Intra-op Plan:   Post-operative Plan:   Informed Consent: I have reviewed the patients History and Physical, chart, labs and discussed the procedure including the risks, benefits and alternatives for the proposed anesthesia with the patient or authorized representative who has indicated his/her understanding and acceptance.     Plan Discussed with: Anesthesiologist, CRNA and Surgeon  Anesthesia Plan Comments:         Anesthesia Quick Evaluation

## 2017-06-24 NOTE — MAU Note (Signed)
Pt reports SROM with large amt pink fluid @ 2348. States irreg, mild contractions, with +    Fetal movement.

## 2017-06-24 NOTE — H&P (Signed)
Tammy Cochran is a 26 y.o. female G2 P1001 presenting for ROM at ~ 11:30 pm yesterday. Contractions have gotten stronger. Pt reports recently that she felt they baby do something that was painful that in hindsight she feels was her turning.  Prenatal care has been uncomplicated with [facility] (Dr. Dion BodyVarnado). OB History    Gravida Para Term Preterm AB Living   2 1 1     1    SAB TAB Ectopic Multiple Live Births           1     Past Medical History:  Diagnosis Date  . Anemia   . Carpal tunnel syndrome during pregnancy   . H/O dysmenorrhea   . Hx: UTI (urinary tract infection)    Past Surgical History:  Procedure Laterality Date  . WISDOM TOOTH EXTRACTION     Family History: family history includes Breast cancer in her maternal grandmother; Cancer in her paternal grandmother; Diabetes in her sister; Hypertension in her maternal grandmother and maternal uncle. Social History:  reports that  has never smoked. she has never used smokeless tobacco. She reports that she does not drink alcohol or use drugs.     Maternal Diabetes: No Genetic Screening: Normal Maternal Ultrasounds/Referrals: Normal Fetal Ultrasounds or other Referrals:  None Maternal Substance Abuse:  No Significant Maternal Medications:  None Significant Maternal Lab Results:  Lab values include: Group B Strep negative Other Comments:  Suspected Macrosomia  Review of Systems  Gastrointestinal: Positive for abdominal pain.   Maternal Medical History:  Reason for admission: Rupture of membranes.   Contractions: Onset was 6-12 hours ago.   Perceived severity is strong.    Fetal activity: Perceived fetal activity is normal.    Prenatal complications: no prenatal complications Prenatal Complications - Diabetes: none.    Dilation: 4 Effacement (%): 80 Station: -1, -2 Exam by:: T. Summers/ S. Moyer RN Blood pressure 123/67, pulse (!) 102, temperature 98.2 F (36.8 C), temperature source Oral, resp. rate 16,  height 5\' 1"  (1.549 m), weight 103.4 kg (228 lb), currently breastfeeding. Maternal Exam:  Uterine Assessment: Contraction strength is firm.  Contraction frequency is irregular.   Abdomen: Patient reports no abdominal tenderness. Estimated fetal weight is 9 pounds.   Fetal presentation: breech  Introitus: not evaluated.   Cervix: not evaluated. 4 cm per RN  Fetal Exam Fetal Monitor Review: Mode: fetoscope.   Baseline rate: 150s.  Variability: moderate (6-25 bpm).   Pattern: accelerations present.    Fetal State Assessment: Category I - tracings are normal.     Physical Exam  Constitutional: She is oriented to person, place, and time. She appears well-developed and well-nourished.  HENT:  Head: Normocephalic and atraumatic.  Eyes: EOM are normal.  Neck: Normal range of motion.  Respiratory: Effort normal. No respiratory distress.  GI: There is no tenderness.  Musculoskeletal: Normal range of motion.  Neurological: She is alert and oriented to person, place, and time.  Skin: Skin is warm and dry.  Psychiatric: She has a normal mood and affect.    Prenatal labs: ABO, Rh: --/--/A POS (11/13 0120) Antibody: NEG (11/13 0120) Rubella: Immune (03/19 0000) RPR: Nonreactive (03/19 0000)  HBsAg: Negative (03/19 0000)  HIV: Non-reactive (03/19 0000)  GBS: Negative (10/24 0000)   Assessment/Plan: IUP @ 40 5/7 weeks H/o previous c-section desires TOLAC but now transverse. SROM. Suspected Macrosomia. Proceed with repeat c-section.  Tammy Cochran, Tammy Cochran 06/24/2017, 9:25 AM

## 2017-06-24 NOTE — Plan of Care (Signed)
Pt educated on Postpartum recovery, pain management, elimination, self care and newborn care.  Pt verbalizes understanding of all education and has no concerns at this time.

## 2017-06-24 NOTE — Brief Op Note (Signed)
06/24/2017  11:00 AM  PATIENT:  Tammy Cochran  26 y.o. female  PRE-OPERATIVE DIAGNOSIS:  IUP @ 40 5/7 weeks, H/o previous c-section, Transverse lie, SROM  POST-OPERATIVE DIAGNOSIS:  Same, [place]ling Breech  PROCEDURE:  Procedure(s): CESAREAN SECTION (N/A), Repeat LTCS  SURGEON:  Surgeon(s) and Role:    Geryl Rankins* Ethelene Closser, MD - Primary    * Gerald Leitzole, Tara, MD - Assisting  PHYSICIAN ASSISTANT:   ASSISTANTS: Dr. Richardson Doppole, Technician   ANESTHESIA:   spinal  EBL:  769 mL   BLOOD ADMINISTERED:none  DRAINS: Urinary Catheter (Foley)   LOCAL MEDICATIONS USED:  NONE  SPECIMEN:  No Specimen  DISPOSITION OF SPECIMEN:  N/a  COUNTS:  YES  TOURNIQUET:  * No tourniquets in log *  DICTATION: .Other Dictation: Dictation Number [id number]  PLAN OF CARE: Transfer to recovery, then postpartum  PATIENT DISPOSITION:  PACU - hemodynamically stable.   Delay start of Pharmacological VTE agent (>24hrs) due to surgical blood loss or risk of bleeding: yes

## 2017-06-24 NOTE — Anesthesia Procedure Notes (Signed)
Spinal  Patient location during procedure: OB Start time: 06/24/2017 9:28 AM End time: 06/24/2017 9:33 AM Staffing Anesthesiologist: Lowella CurbMiller, Jaynell Castagnola Ray, MD Performed: anesthesiologist  Preanesthetic Checklist Completed: patient identified, surgical consent, pre-op evaluation, timeout performed, IV checked, risks and benefits discussed and monitors and equipment checked Spinal Block Patient position: sitting Prep: site prepped and draped and DuraPrep Patient monitoring: heart rate, cardiac monitor, continuous pulse ox and blood pressure Approach: midline Location: L3-4 Injection technique: single-shot Needle Needle type: Pencan  Needle gauge: 24 G Needle length: 10 cm Assessment Sensory level: T4

## 2017-06-24 NOTE — Anesthesia Postprocedure Evaluation (Signed)
Anesthesia Post Note  Patient: Tammy Cochran  Procedure(s) Performed: CESAREAN SECTION (N/A )     Anesthesia Post Evaluation  Last Vitals:  Vitals:   06/24/17 1230 06/24/17 1245  BP: (!) 110/55 (!) 119/59  Pulse: 79 65  Resp: (!) 23 19  Temp: 36.7 C 36.7 C  SpO2: 100% 100%    Last Pain:  Vitals:   06/24/17 1245  TempSrc: Oral  PainSc: 2    Pain Goal:                 Lowella CurbWarren Ray Miller

## 2017-06-24 NOTE — Transfer of Care (Signed)
Immediate Anesthesia Transfer of Care Note  Patient: Tammy Cochran  Procedure(s) Performed: CESAREAN SECTION (N/A )  Patient Location: PACU  Anesthesia Type:Spinal  Level of Consciousness: awake and patient cooperative  Airway & Oxygen Therapy: Patient Spontanous Breathing  Post-op Assessment: Report given to RN and Post -op Vital signs reviewed and stable  Post vital signs: Reviewed and stable  Last Vitals:  Vitals:   06/24/17 0527 06/24/17 0803  BP: 135/70 123/67  Pulse: 90 (!) 102  Resp: 16   Temp: 37 C 36.8 C    Last Pain:  Vitals:   06/24/17 0803  TempSrc: Oral  PainSc:          Complications: No apparent anesthesia complications

## 2017-06-25 ENCOUNTER — Inpatient Hospital Stay (HOSPITAL_COMMUNITY): Payer: Managed Care, Other (non HMO)

## 2017-06-25 LAB — CBC
HCT: 30.3 % — ABNORMAL LOW (ref 36.0–46.0)
Hemoglobin: 9.4 g/dL — ABNORMAL LOW (ref 12.0–15.0)
MCH: 23.5 pg — AB (ref 26.0–34.0)
MCHC: 31 g/dL (ref 30.0–36.0)
MCV: 75.8 fL — AB (ref 78.0–100.0)
PLATELETS: 213 10*3/uL (ref 150–400)
RBC: 4 MIL/uL (ref 3.87–5.11)
RDW: 19 % — AB (ref 11.5–15.5)
WBC: 8.5 10*3/uL (ref 4.0–10.5)

## 2017-06-25 LAB — BIRTH TISSUE RECOVERY COLLECTION (PLACENTA DONATION)

## 2017-06-25 NOTE — Lactation Note (Signed)
This note was copied from a baby's chart. Lactation Consultation Note  Patient Name: Tammy Cochran Today's Date: 06/25/2017 Reason for consult: Initial assessment  Baby is 4632 hours old, mom breast her 1st baby few months  LC reviewed and updated doc flow sheets .  As LC entered the room , baby and crying and a large stool changed  LC assisted mom to latch on the right breast after mom massaged the breast  And reverse pressure. Baby opened wide and LC assisted to obtain depth .  Swallows noted and increased with breast compressions.  Per mom comfortable , baby still feeding at 10 mins.  See doc flow sheets for details.  Mother informed of post-discharge support and given phone number to the lactation department, including services for phone call assistance; out-patient appointments; and breastfeeding support group. List of other  breastfeeding resources in the community given in the handout. Encouraged mother to call for problems or  concerns related to breastfeeding.   Maternal Data Has patient been taught Hand Expression?: Yes Does the patient have breastfeeding experience prior to this delivery?: Yes  Feeding Feeding Type: Breast Fed(LC assisted mom ) Length of feed: (has been feeding 8 min and is still feeding )  LATCH Score Latch: Grasps breast easily, tongue down, lips flanged, rhythmical sucking.(LC assisted )  Audible Swallowing: A few with stimulation(and then went to 2)  Type of Nipple: Everted at rest and after stimulation  Comfort (Breast/Nipple): Soft / non-tender  Hold (Positioning): Assistance needed to correctly position infant at breast and maintain latch.  LATCH Score: 8  Interventions Interventions: Breast feeding basics reviewed;Assisted with latch;Skin to skin;Breast massage;Hand express;Pre-pump if needed;Reverse pressure;Breast compression;Adjust position;Support pillows;Position options;Expressed milk  Lactation Tools Discussed/Used      Consult Status Consult Status: Follow-up Date: 06/26/17 Follow-up type: In-patient    Matilde SprangMargaret Ann Lurene Robley 06/25/2017, 6:35 PM

## 2017-06-25 NOTE — Anesthesia Postprocedure Evaluation (Signed)
Anesthesia Post Note  Patient: Tammy Cochran  Procedure(s) Performed: CESAREAN SECTION (N/A )     Patient location during evaluation: Mother Baby Anesthesia Type: Spinal Level of consciousness: awake, awake and alert and oriented Pain management: pain level controlled Vital Signs Assessment: post-procedure vital signs reviewed and stable Respiratory status: spontaneous breathing, nonlabored ventilation and respiratory function stable Postop Assessment: no headache, no backache, no apparent nausea or vomiting, patient able to bend at knees and adequate PO intake Anesthetic complications: no    Last Vitals:  Vitals:   06/25/17 0617 06/25/17 0843  BP:  (!) 118/58  Pulse:  86  Resp:  18  Temp:  36.8 C  SpO2: 98% 100%    Last Pain:  Vitals:   06/25/17 0843  TempSrc: Oral  PainSc: 0-No pain   Pain Goal:                 Brayon Bielefeld

## 2017-06-25 NOTE — Addendum Note (Signed)
Addendum  created 06/25/17 1017 by Jachin Coury, Doree Fudgeolleen S, CRNA   Charge Capture section accepted, Sign clinical note

## 2017-06-25 NOTE — Op Note (Signed)
Tammy Cochran:  Tammy Cochran, Tammy Cochran                 ACCOUNT NO.:  [id number]  MEDICAL RECORD NO.:  [id number]  LOCATION:  [id number]                          FACILITY:  [facility]  PHYSICIAN:  Tammy Cochran, Tammy Cochran   DATE OF BIRTH:  June 23, 1991  DATE OF PROCEDURE:  06/24/2017 DATE OF DISCHARGE:                              OPERATIVE REPORT   PREOPERATIVE DIAGNOSES: 1. Intrauterine pregnancy at 40-5/7th weeks. 2. History of previous cesarean section, transverse lie. 3. Spontaneous rupture of membranes.  POSTOPERATIVE DIAGNOSES: 1. Intrauterine pregnancy at 40-5/7th weeks. 2. History of previous cesarean section. 3. Spontaneous rupture of membranes. 4. Footling breech.  PROCEDURE:  Repeat low transverse cesarean section with 2-layer closure.  SURGEON:  Tammy Cochran, Tammy Cochran.  ASSISTANTS:  Tammy Cochran, Tammy Cochran, and technician.  ANESTHESIA:  Spinal.  ESTIMATED BLOOD LOSS:  769.  BLOOD ADMINISTERED:  None.  Foley for drains.  LOCAL:  None.  SPECIMEN:  None.  COUNTS CORRECT:  Yes.  PATIENT DISPOSITION:  To PACU, hemodynamically stable.  COMPLICATIONS:  None.  FINDINGS:  Viable female infant in the footling breech presentation, 1 foot was in the vagina, the other was at the hysterotomy incision. Endometrial cavity is normal.  Normal fallopian tubes and ovaries bilaterally.  Macrosomic infant noted.  Apgars 9 and 9.  INDICATIONS:  [name] presented with spontaneous rupture of membranes. She was 1 to 2 cm on admission.  She spontaneously contracted and was 4 cm.  On the cervical exam, the malpresentation suspected.  Ultrasound done by Dr. Richardson Cochran, showed breech presentation, which I confirmed and considered the presentation transverse at that time.  Reassuring fetal status noted.  DESCRIPTION OF PROCEDURE:  The patient was taken to the operating room, underwent spinal anesthesia without complication.  She was placed in the dorsal lithotomy with a leftward tilt.  She was then prepped and while we were  waiting for the prep to dry, time-out was performed.  Prior to putting the drape on, the low mons pubis incision was marked and drape was placed.  Adequate anesthesia confirmed.  Father brought to the bedside.  SCDs were on her legs and operating.  The patient had received Ancef 2 g IV prior to incision.  A Pfannenstiel skin incision was made with a scalpel and carried down to the underlying layer of the fascia with the Bovie on cut at times. Hemostasis with the Bovie as needed.  The subcutaneous space was very deep.  The fascial incision was extended laterally with the curved Mayo scissors and then sharply dissected off the rectus muscles with the curved Mayo scissors.  The midline was scarred and so a scalpel was used to separate the muscles.  Intraperitoneal access was then confirmed and the peritoneum was stretched.  Intraabdominal sweep was performed, no adhesions noted, so the Alexis retractor was placed.  A bladder flap was developed with the Metzenbaum scissors and the Russian's in the serosa. Low transverse incision was made with a scalpel and extended manually. Buttocks were noted at the incision and the foot was right there as well, so the foot at the incision was drawn out and the other foot was flexed and brought out of the vagina.  Then, the breech baby was delivered in a normal fashion without any trauma using a moistened blue towel.  Delayed cord clamping was performed.  Baby's nose and mouth were suctioned.  While we were awaiting 1 minute, the hysterotomy incision was grasped with the ring forceps.  At 1 minute, the cord was clamped x2 and cut and baby was handed off to the awaiting NICU team.  The placenta was then removed manually and the uterus was cleared of all clots and debris with a moist laparotomy sponge.  Hysterotomy incision was then reapproximated with 0 Vicryl in a continuous running fashion.  Second layer of the same suture was used for imbrication.   There were several U stitches that were placed for hemostasis.  Once the hysterotomy incision was hemostatic, the gutters were cleared of all clots and debris with irrigation.  Interceed was then applied at the hysterotomy incision.  The peritoneum was then reapproximated with 2-0 Vicryl in a continuous running fashion.  The fascia was then reapproximated with 0 Vicryl in a continuous running fashion and the subcutaneous space was closed in 2 layers.  The skin was reapproximated with 4-0 Monocryl in a subcuticular fashion and there were 2 little areas that were put as single stitches. Of note, the patient did have a keloid and I used the same incision, so that was brought together.  A few Steri-Strips will be applied and the PICO dressing will be applied.  All instrument, sponge, and needle counts were correct x3.  Baby and mother doing well in the operating room.  She will be taken to the recovery room in stable condition.     Tammy PartridgeEvelyn B Kyan Giannone, Tammy Cochran    EBV/MEDQ  D:  06/24/2017  T:  06/25/2017  Job:  [id number]

## 2017-06-25 NOTE — Progress Notes (Signed)
Subjective: Postop Day 1: Cesarean Delivery No complaints.  Pain controlled.  Lochia normal.  Breast feeding yes.  Objective: Temp:  [97.4 F (36.3 C)-98.6 F (37 C)] 98.2 F (36.8 C) (11/14 0843) Pulse Rate:  [81-89] 86 (11/14 0843) Resp:  [18-22] 18 (11/14 0843) BP: (96-121)/(47-61) 118/58 (11/14 0843) SpO2:  [97 %-100 %] 100 % (11/14 0843) UOP good  Physical Exam: Gen: NAD Lochia: Not visualized Uterine Fundus: firm, appropriately tender Pannus edematous Incision: PICO dressing operative, minimally soiled DVT Evaluation: + Edema present, no calf tenderness bilaterally   Recent Labs    06/24/17 0120 06/25/17 0539  HGB 9.8* 9.4*  HCT 31.2* 30.3*    Assessment/Plan: Status post C-section-doing well postoperatively. Continue routine post op/postpartum care. Breast feeding lactation support. Encouraged ambulation in halls TID. Consider discharge tomorrow if pt desires.    Geryl RankinsVARNADO, Takenya Travaglini 06/25/2017, 1:08 PM

## 2017-06-26 MED ORDER — IBUPROFEN 600 MG PO TABS: 600.0000 mg | ORAL_TABLET | Freq: Four times a day (QID) | ORAL | 0 refills | Status: AC

## 2017-06-26 MED ORDER — OXYCODONE-ACETAMINOPHEN 5-325 MG PO TABS: 1.0000 | ORAL_TABLET | Freq: Four times a day (QID) | ORAL | 0 refills | Status: AC | PRN

## 2017-06-26 MED ORDER — DOCUSATE SODIUM 100 MG PO CAPS: 100.0000 mg | ORAL_CAPSULE | Freq: Two times a day (BID) | ORAL | 0 refills | Status: AC

## 2017-06-26 NOTE — Progress Notes (Signed)
Subjective: Postop Day 2: Cesarean Delivery  Pain controlled.  Lochia normal.  Breast feeding yes.  No F/C/CP/SOB.  Tolerating gen diet.  +flatus, no BM.  Voiding and ambulating without difficulty  Objective: Temp:  [97.8 F (36.6 C)-98.2 F (36.8 C)] 98.1 F (36.7 C) (11/15 0645) Pulse Rate:  [84-87] 84 (11/15 0645) Resp:  [17-18] 18 (11/15 0645) BP: (118-129)/(58-73) 129/68 (11/15 0645) SpO2:  [100 %] 100 % (11/15 0645)  Physical Exam: Gen: NAD CV: RRR Lungs: CTAB Abd: soft, non-tender, no rebound, no guarding, +BS Uterine Fundus: firm, below umbilicus Incision: PICO dressing operative, minimally soiled DVT Evaluation: 1+ non-pitting Edema present, no calf tenderness bilaterally   Recent Labs    06/24/17 0120 06/25/17 0539  HGB 9.8* 9.4*  HCT 31.2* 30.3*    Assessment/Plan: 26yo G2P2 s/p repeat C-section, POD#2 -Doing well postoperatively. Continue routine post op/postpartum care. Breast feeding lactation support. Encouraged ambulation in halls TID. Plan for discharge home today if desired   Tammy Cochran, Tammy Cochran, M 06/26/2017, 7:03 AM

## 2017-06-26 NOTE — Discharge Summary (Signed)
OB Discharge Summary     Patient Name: Tammy Cochran DOB: [birth year] MRN: [id number]  Date of admission: 06/24/2017 Delivering MD: Geryl RankinsVARNADO, EVELYN   Date of discharge: 06/26/2017  Admitting diagnosis: 40 WEEKS CTX ROM Intrauterine pregnancy: [redacted]w[redacted]d     Secondary diagnosis:  Active Problems:   Normal labor  Additional problems: Prior C-section, Breech presentation     Discharge diagnosis: Term Pregnancy Delivered                                                                                                Post partum procedures:none  Augmentation: n/a  Complications: None  Hospital course:  Sceduled C/S   26 y.o. yo G2P1001 at 477w0d was admitted to the hospital 06/24/2017 for scheduled cesarean section with the following indication:Malpresentation.  Membrane Rupture Time/Date: 11:48 PM ,06/23/2017   Patient delivered a Viable infant.06/24/2017  Details of operation can be found in separate operative note.  Pateint had an uncomplicated postpartum course.  She is ambulating, tolerating a regular diet, passing flatus, and urinating well. Patient is discharged home in stable condition on  06/26/17         Physical exam  Vitals:   06/25/17 0617 06/25/17 0843 06/25/17 1733 06/26/17 0645  BP:  (!) 118/58 127/73 129/68  Pulse:  86 87 84  Resp:  18 17 18   Temp:  98.2 F (36.8 C) 97.8 F (36.6 C) 98.1 F (36.7 C)  TempSrc:  Oral Oral Oral  SpO2: 98% 100% 100% 100%  Weight:      Height:       General: alert, cooperative and no distress Lochia: appropriate Uterine Fundus: firm Incision: Dressing is clean, dry, and intact DVT Evaluation: No evidence of DVT seen on physical exam. Labs: Lab Results  Component Value Date   WBC 8.5 06/25/2017   HGB 9.4 (L) 06/25/2017   HCT 30.3 (L) 06/25/2017   MCV 75.8 (L) 06/25/2017   PLT 213 06/25/2017   No flowsheet data found.  Discharge instruction: per After Visit Summary and "Baby and Me Booklet".  After visit meds:  Allergies  as of 06/26/2017   No Known Allergies     Medication List    TAKE these medications   docusate sodium 100 MG capsule Commonly known as:  COLACE Take 1 capsule (100 mg total) 2 (two) times daily by mouth.   ferrous sulfate 325 (65 FE) MG tablet Take 325 mg by mouth daily with breakfast.   ibuprofen 600 MG tablet Commonly known as:  ADVIL,MOTRIN Take 1 tablet (600 mg total) every 6 (six) hours by mouth.   oxyCODONE-acetaminophen 5-325 MG tablet Commonly known as:  PERCOCET/ROXICET Take 1 tablet every 6 (six) hours as needed by mouth (pain scale 4-7).   PRENATAL GUMMIES/DHA & FA PO Take 1 each daily by mouth.       Diet: routine diet  Activity: Advance as tolerated. Pelvic rest for 6 weeks.   Outpatient follow up:2 weeks Follow up Appt:No future appointments. Follow up Visit:No Follow-up on file.  Postpartum contraception: Not Discussed  Newborn Data: Live born female  Birth Weight:  9 lb 6.4 oz (4265 g) APGAR: 9, 9  Newborn Delivery   Birth date/time:  06/24/2017 10:00:00 Delivery type:  C-Section, Low Transverse C-section categorization:  Repeat     Baby Feeding: Breast Disposition:home with mother   06/26/2017 Myna HidalgoZAN, Javaun Dimperio, M, DO

## 2017-08-08 ENCOUNTER — Other Ambulatory Visit (HOSPITAL_COMMUNITY)
Admission: RE | Admit: 2017-08-08 | Discharge: 2017-08-08 | Disposition: A | Discharge status: Home / Self Care | Payer: Managed Care, Other (non HMO) | Source: Ambulatory Visit | Attending: Obstetrics and Gynecology | Admitting: Obstetrics and Gynecology

## 2017-08-08 ENCOUNTER — Other Ambulatory Visit: Payer: Self-pay | Admitting: Obstetrics and Gynecology

## 2017-08-08 DIAGNOSIS — Z01419 Encounter for gynecological examination (general) (routine) without abnormal findings: Secondary | ICD-10-CM | POA: Diagnosis not present

## 2017-08-14 LAB — CYTOLOGY - PAP: Diagnosis: NEGATIVE

## 2017-09-11 ENCOUNTER — Encounter (HOSPITAL_COMMUNITY): Payer: Self-pay

## 2021-10-07 ENCOUNTER — Ambulatory Visit
Admission: EM | Admit: 2021-10-07 | Discharge: 2021-10-07 | Disposition: A | Discharge status: Home / Self Care | Payer: Managed Care, Other (non HMO) | Attending: Internal Medicine | Admitting: Internal Medicine

## 2021-10-07 ENCOUNTER — Encounter: Payer: Self-pay | Admitting: Emergency Medicine

## 2021-10-07 ENCOUNTER — Other Ambulatory Visit: Payer: Self-pay

## 2021-10-07 DIAGNOSIS — J069 Acute upper respiratory infection, unspecified: Secondary | ICD-10-CM | POA: Insufficient documentation

## 2021-10-07 DIAGNOSIS — J029 Acute pharyngitis, unspecified: Secondary | ICD-10-CM | POA: Insufficient documentation

## 2021-10-07 LAB — POCT RAPID STREP A (OFFICE): Rapid Strep A Screen: NEGATIVE

## 2021-10-07 MED ORDER — FLUTICASONE PROPIONATE 50 MCG/ACT NA SUSP: 1.0000 | Freq: Every day | NASAL | 0 refills | Status: AC

## 2021-10-07 MED ORDER — BENZONATATE 100 MG PO CAPS: 100.0000 mg | ORAL_CAPSULE | Freq: Three times a day (TID) | ORAL | 0 refills | Status: AC | PRN

## 2021-10-07 NOTE — ED Triage Notes (Signed)
Patient c/o cough, congestion, loosing her voice x 5 days.  Patient has taken OTC meds.

## 2021-10-07 NOTE — ED Provider Notes (Signed)
[name]    CSN: [id number] Arrival date & time: 10/07/21  7253      History   Chief Complaint Chief Complaint  Patient presents with   Cough    HPI Tammy Cochran is a 31 y.o. female.   Patient presents with cough, nasal congestion, sore throat that has been present for approximately 5 days.  She reports that she has been exposed to strep throat.  Denies any known fevers.  Denies chest pain, shortness of breath, ear pain, nausea, vomiting, diarrhea, abdominal pain.  Patient has taken several over-the-counter cold and flu medications with no improvement in symptoms.   Cough  Past Medical History:  Diagnosis Date   Anemia    Carpal tunnel syndrome during pregnancy    H/O dysmenorrhea    Hx: UTI (urinary tract infection)     Patient Active Problem List   Diagnosis Date Noted   Normal labor 06/24/2017   Transverse fetal lie 04/30/2014   Severe obesity (BMI >= 40) (HCC) 04/30/2014   Cesarean delivery delivered 04/29/2014    Past Surgical History:  Procedure Laterality Date   CESAREAN SECTION N/A 04/29/2014   Procedure: CESAREAN SECTION;  Surgeon: Esmeralda Arthur, MD;  Location: [facility]S;  Service: Obstetrics;  Laterality: N/A;   CESAREAN SECTION N/A 06/24/2017   Procedure: CESAREAN SECTION;  Surgeon: Geryl Rankins, MD;  Location: [facility] BIRTHING SUITES;  Service: Obstetrics;  Laterality: N/A;   WISDOM TOOTH EXTRACTION      OB History     Gravida  2   Para  1   Term  1   Preterm      AB      Living  1      SAB      IAB      Ectopic      Multiple      Live Births  1            Home Medications    Prior to Admission medications   Medication Sig Start Date End Date Taking? Authorizing Provider  benzonatate (TESSALON) 100 MG capsule Take 1 capsule (100 mg total) by mouth every 8 (eight) hours as needed for cough. 10/07/21  Yes Mylena Sedberry, Rolly Salter E, FNP  fluticasone (FLONASE) 50 MCG/ACT nasal spray Place 1 spray into both nostrils daily for  3 days. 10/07/21 10/10/21 Yes Symeon Puleo, Acie Fredrickson, FNP  docusate sodium (COLACE) 100 MG capsule Take 1 capsule (100 mg total) 2 (two) times daily by mouth. 06/26/17   Myna Hidalgo, DO  ferrous sulfate 325 (65 FE) MG tablet Take 325 mg by mouth daily with breakfast.    [provider]  ibuprofen (ADVIL,MOTRIN) 600 MG tablet Take 1 tablet (600 mg total) every 6 (six) hours by mouth. 06/26/17   Myna Hidalgo, DO  oxyCODONE-acetaminophen (PERCOCET/ROXICET) 5-325 MG tablet Take 1 tablet every 6 (six) hours as needed by mouth (pain scale 4-7). 06/26/17   Myna Hidalgo, DO  Prenatal MV-Min-FA-Omega-3 (PRENATAL GUMMIES/DHA & FA PO) Take 1 each daily by mouth.    [provider]    Family History Family History  Problem Relation Age of Onset   Healthy Mother    Healthy Father    Diabetes Sister        GESTATIONAL   Hypertension Maternal Grandmother    Breast cancer Maternal Grandmother    Cancer Paternal Grandmother        LYMPHOMA   Hypertension Maternal Uncle     Social History Social History  Tobacco Use   Smoking status: Never   Smokeless tobacco: Never  Substance Use Topics   Alcohol use: No   Drug use: No     Allergies   Patient has no known allergies.   Review of Systems Review of Systems Per HPI  Physical Exam Triage Vital Signs ED Triage Vitals [10/07/21 0824]  Enc Vitals Group     BP 116/77     Pulse Rate 87     Resp 18     Temp 98.7 F (37.1 C)     Temp Source Oral     SpO2 98 %     Weight 220 lb (99.8 kg)     Height 5\' 1"  (1.549 m)     Head Circumference      Peak Flow      Pain Score 3     Pain Loc      Pain Edu?      Excl. in GC?    No data found.  Updated Vital Signs BP 116/77 (BP Location: Left Arm)    Pulse 87    Temp 98.7 F (37.1 C) (Oral)    Resp 18    Ht 5\' 1"  (1.549 m)    Wt 220 lb (99.8 kg)    SpO2 98%    Breastfeeding No    BMI 41.57 kg/m   Visual Acuity Right Eye Distance:   Left Eye Distance:   Bilateral  Distance:    Right Eye Near:   Left Eye Near:    Bilateral Near:     Physical Exam Constitutional:      General: She is not in acute distress.    Appearance: Normal appearance. She is not toxic-appearing or diaphoretic.  HENT:     Head: Normocephalic and atraumatic.     Right Ear: Tympanic membrane and ear canal normal.     Left Ear: Tympanic membrane and ear canal normal.     Nose: Congestion present.     Mouth/Throat:     Mouth: Mucous membranes are moist.     Pharynx: Posterior oropharyngeal erythema present.  Eyes:     Extraocular Movements: Extraocular movements intact.     Conjunctiva/sclera: Conjunctivae normal.     Pupils: Pupils are equal, round, and reactive to light.  Cardiovascular:     Rate and Rhythm: Normal rate and regular rhythm.     Pulses: Normal pulses.     Heart sounds: Normal heart sounds.  Pulmonary:     Effort: Pulmonary effort is normal. No respiratory distress.     Breath sounds: Normal breath sounds. No wheezing.  Abdominal:     General: Abdomen is flat. Bowel sounds are normal.     Palpations: Abdomen is soft.  Musculoskeletal:        General: Normal range of motion.     Cervical back: Normal range of motion.  Skin:    General: Skin is warm and dry.  Neurological:     General: No focal deficit present.     Mental Status: She is alert and oriented to person, place, and time. Mental status is at baseline.  Psychiatric:        Mood and Affect: Mood normal.        Behavior: Behavior normal.     UC Treatments / Results  Labs (all labs ordered are listed, but only abnormal results are displayed) Labs Reviewed  COVID-19, FLU A+B NAA  CULTURE, GROUP A STREP [facility])  POCT RAPID STREP A (OFFICE)  EKG   Radiology No results found.  Procedures Procedures (including critical care time)  Medications Ordered in UC Medications - No data to display  Initial Impression / Assessment and Plan / UC Course  I have reviewed the triage vital  signs and the nursing notes.  Pertinent labs & imaging results that were available during my care of the patient were reviewed by me and considered in my medical decision making (see chart for details).     Patient presents with symptoms likely from a viral upper respiratory infection. Differential includes bacterial pneumonia, sinusitis, allergic rhinitis, COVID, flu. Do not suspect underlying cardiopulmonary process. Symptoms seem unlikely related to ACS, CHF or COPD exacerbations, pneumonia, pneumothorax. Patient is nontoxic appearing and not in need of emergent medical intervention.  Rapid strep was negative.  Throat culture is pending.  Low suspicion for strep throat given appearance of posterior pharynx on exam even though patient was possibly exposed.  COVID and flu test pending.  Recommended symptom control with over the counter medications: Daily oral anti-histamine, Oral decongestant or IN corticosteroid, saline irrigations, cepacol lozenges, Robitussin, Delsym, honey tea.  Patient sent prescriptions.  Return if symptoms fail to improve in 1-2 weeks or you develop shortness of breath, chest pain, severe headache. Patient states understanding and is agreeable.  Discharged with PCP followup.  Final Clinical Impressions(s) / UC Diagnoses   Final diagnoses:  Viral upper respiratory tract infection with cough  Sore throat     Discharge Instructions      It appears that you have a viral upper respiratory infection that should self resolve in the next few days with symptomatic treatment.  Rapid strep was negative.  Throat culture, COVID-19, flu test is pending.  We will call if it is positive.  You have been sent two medications to alleviate symptoms.     ED Prescriptions     Medication Sig Dispense Auth. Provider   fluticasone (FLONASE) 50 MCG/ACT nasal spray Place 1 spray into both nostrils daily for 3 days. 16 g Maryanne Huneycutt, Rolly Salter E, [place]   benzonatate (TESSALON) 100 MG capsule Take  1 capsule (100 mg total) by mouth every 8 (eight) hours as needed for cough. 21 capsule [place], Acie Fredrickson, [place]      PDMP not reviewed this encounter.   Gustavus Bryant, [place] 10/07/21 [id number]

## 2021-10-07 NOTE — Discharge Instructions (Signed)
It appears that you have a viral upper respiratory infection that should self resolve in the next few days with symptomatic treatment.  Rapid strep was negative.  Throat culture, COVID-19, flu test is pending.  We will call if it is positive.  You have been sent two medications to alleviate symptoms.

## 2021-10-08 LAB — COVID-19, FLU A+B NAA
Influenza A, NAA: NOT DETECTED
Influenza B, NAA: NOT DETECTED
SARS-CoV-2, NAA: NOT DETECTED

## 2021-10-10 LAB — CULTURE, GROUP A STREP (THRC)

## 2022-08-27 ENCOUNTER — Ambulatory Visit
Admission: EM | Admit: 2022-08-27 | Discharge: 2022-08-27 | Disposition: A | Discharge status: Home / Self Care | Payer: Managed Care, Other (non HMO) | Attending: Physician Assistant | Admitting: Physician Assistant

## 2022-08-27 DIAGNOSIS — J069 Acute upper respiratory infection, unspecified: Secondary | ICD-10-CM | POA: Insufficient documentation

## 2022-08-27 DIAGNOSIS — Z1152 Encounter for screening for COVID-19: Secondary | ICD-10-CM | POA: Diagnosis not present

## 2022-08-27 MED ORDER — AMOXICILLIN-POT CLAVULANATE 875-125 MG PO TABS: 1.0000 | ORAL_TABLET | Freq: Two times a day (BID) | ORAL | 0 refills | Status: AC

## 2022-08-27 NOTE — ED Provider Notes (Signed)
[name]    CSN: [id number] Arrival date & time: 08/27/22  1517      History   Chief Complaint Chief Complaint  Patient presents with   URI    HPI Tammy Cochran is a 32 y.o. female.   Patient here today for evaluation of cough she has had for the last week. She reports symptoms worsened today, fever started today. She denies any vomiting, diarrhea, sore throat. She has tried OTC meds without resolution.      Past Medical History:  Diagnosis Date   Anemia    Carpal tunnel syndrome during pregnancy    H/O dysmenorrhea    Hx: UTI (urinary tract infection)     Patient Active Problem List   Diagnosis Date Noted   Normal labor 06/24/2017   Transverse fetal lie 04/30/2014   Severe obesity (BMI >= 40) ([place]) 04/30/2014   Cesarean delivery delivered 04/29/2014    Past Surgical History:  Procedure Laterality Date   CESAREAN SECTION N/A 04/29/2014   Procedure: CESAREAN SECTION;  Surgeon: Alwyn Pea, MD;  Location: [facility];  Service: Obstetrics;  Laterality: N/A;   CESAREAN SECTION N/A 06/24/2017   Procedure: CESAREAN SECTION;  Surgeon: Thurnell Lose, MD;  Location: [facility];  Service: Obstetrics;  Laterality: N/A;   WISDOM TOOTH EXTRACTION      OB History     Gravida  2   Para  1   Term  1   Preterm      AB      Living  1      SAB      IAB      Ectopic      Multiple      Live Births  1            Home Medications    Prior to Admission medications   Medication Sig Start Date End Date Taking? Authorizing Provider  amoxicillin-clavulanate (AUGMENTIN) 875-125 MG tablet Take 1 tablet by mouth every 12 (twelve) hours. 08/27/22  Yes Francene Finders, PA-C  benzonatate (TESSALON) 100 MG capsule Take 1 capsule (100 mg total) by mouth every 8 (eight) hours as needed for cough. 10/07/21   Teodora Medici, FNP  docusate sodium (COLACE) 100 MG capsule Take 1 capsule (100 mg total) 2 (two) times daily by mouth. 06/26/17   Janyth Pupa, DO  ferrous sulfate 325 (65 FE) MG tablet Take 325 mg by mouth daily with breakfast.    [provider]  fluticasone (FLONASE) 50 MCG/ACT nasal spray Place 1 spray into both nostrils daily for 3 days. 10/07/21 10/10/21  Teodora Medici, FNP  ibuprofen (ADVIL,MOTRIN) 600 MG tablet Take 1 tablet (600 mg total) every 6 (six) hours by mouth. 06/26/17   Janyth Pupa, DO  oxyCODONE-acetaminophen (PERCOCET/ROXICET) 5-325 MG tablet Take 1 tablet every 6 (six) hours as needed by mouth (pain scale 4-7). 06/26/17   Janyth Pupa, DO  Prenatal MV-Min-FA-Omega-3 (PRENATAL GUMMIES/DHA & FA PO) Take 1 each daily by mouth.    [provider]    Family History Family History  Problem Relation Age of Onset   Healthy Mother    Healthy Father    Diabetes Sister        GESTATIONAL   Hypertension Maternal Grandmother    Breast cancer Maternal Grandmother    Cancer Paternal Grandmother        LYMPHOMA   Hypertension Maternal Uncle     Social History Social History  Tobacco Use   Smoking status: Never   Smokeless tobacco: Never  Substance Use Topics   Alcohol use: No   Drug use: No     Allergies   Patient has no known allergies.   Review of Systems Review of Systems  Constitutional:  Positive for fever. Negative for chills.  HENT:  Positive for congestion. Negative for ear pain and sore throat.   Eyes:  Negative for discharge and redness.  Respiratory:  Positive for cough. Negative for shortness of breath and wheezing.   Gastrointestinal:  Negative for abdominal pain, diarrhea, nausea and vomiting.     Physical Exam Triage Vital Signs ED Triage Vitals  Enc Vitals Group     BP      Pulse      Resp      Temp      Temp src      SpO2      Weight      Height      Head Circumference      Peak Flow      Pain Score      Pain Loc      Pain Edu?      Excl. in GC?    No data found.  Updated Vital Signs BP 128/84 (BP Location: Left Arm)   Pulse (!) 110    Temp (!) 100.7 F (38.2 C) (Oral)   Resp 18   LMP  (LMP Unknown)   SpO2 95%     Physical Exam Vitals and nursing note reviewed.  Constitutional:      General: She is not in acute distress.    Appearance: Normal appearance. She is not ill-appearing.  HENT:     Head: Normocephalic and atraumatic.     Nose: Congestion present.     Mouth/Throat:     Mouth: Mucous membranes are moist.     Pharynx: No oropharyngeal exudate or posterior oropharyngeal erythema.  Eyes:     Conjunctiva/sclera: Conjunctivae normal.  Cardiovascular:     Rate and Rhythm: Normal rate and regular rhythm.     Heart sounds: Normal heart sounds. No murmur heard. Pulmonary:     Effort: Pulmonary effort is normal. No respiratory distress.     Breath sounds: Normal breath sounds. No wheezing, rhonchi or rales.  Skin:    General: Skin is warm and dry.  Neurological:     Mental Status: She is alert.  Psychiatric:        Mood and Affect: Mood normal.        Thought Content: Thought content normal.      UC Treatments / Results  Labs (all labs ordered are listed, but only abnormal results are displayed) Labs Reviewed  SARS CORONAVIRUS 2 (TAT 6-24 HRS)    EKG   Radiology No results found.  Procedures Procedures (including critical care time)  Medications Ordered in UC Medications - No data to display  Initial Impression / Assessment and Plan / UC Course  I have reviewed the triage vital signs and the nursing notes.  Pertinent labs & imaging results that were available during my care of the patient were reviewed by me and considered in my medical decision making (see chart for details).    Given duration of symptoms will treat to cover bacterial infection, discussed possibility of development of additional viral illness as well and will screen for COVID.  Encouraged symptomatic treatment and increase fluids.  Patient declines Tamiflu at this time.  Encouraged follow-up with any  further concerns or  worsening symptoms.  Final Clinical Impressions(s) / UC Diagnoses   Final diagnoses:  Acute upper respiratory infection  Encounter for screening for COVID-19   Discharge Instructions   None    ED Prescriptions     Medication Sig Dispense Auth. Provider   amoxicillin-clavulanate (AUGMENTIN) 875-125 MG tablet Take 1 tablet by mouth every 12 (twelve) hours. 14 tablet Francene Finders, PA-C      PDMP not reviewed this encounter.   Francene Finders, PA-C 08/27/22 [phone]

## 2022-08-27 NOTE — ED Triage Notes (Signed)
Pt presents with sinus pain, chest congestion, and non productive cough X 1 week.

## 2022-08-28 LAB — SARS CORONAVIRUS 2 (TAT 6-24 HRS): SARS Coronavirus 2: NEGATIVE

## 2023-08-16 ENCOUNTER — Encounter (HOSPITAL_BASED_OUTPATIENT_CLINIC_OR_DEPARTMENT_OTHER): Payer: Self-pay

## 2023-08-16 ENCOUNTER — Other Ambulatory Visit: Payer: Self-pay

## 2023-08-16 ENCOUNTER — Emergency Department (HOSPITAL_BASED_OUTPATIENT_CLINIC_OR_DEPARTMENT_OTHER)
Admission: EM | Admit: 2023-08-16 | Discharge: 2023-08-16 | Disposition: A | Discharge status: Home / Self Care | Payer: Managed Care, Other (non HMO) | Attending: Emergency Medicine | Admitting: Emergency Medicine

## 2023-08-16 ENCOUNTER — Emergency Department (HOSPITAL_BASED_OUTPATIENT_CLINIC_OR_DEPARTMENT_OTHER): Payer: Managed Care, Other (non HMO)

## 2023-08-16 DIAGNOSIS — Z20822 Contact with and (suspected) exposure to covid-19: Secondary | ICD-10-CM | POA: Insufficient documentation

## 2023-08-16 DIAGNOSIS — J181 Lobar pneumonia, unspecified organism: Secondary | ICD-10-CM | POA: Diagnosis not present

## 2023-08-16 DIAGNOSIS — J189 Pneumonia, unspecified organism: Secondary | ICD-10-CM

## 2023-08-16 DIAGNOSIS — R5383 Other fatigue: Secondary | ICD-10-CM | POA: Diagnosis present

## 2023-08-16 LAB — COMPREHENSIVE METABOLIC PANEL
ALT: 38 U/L (ref 0–44)
AST: 31 U/L (ref 15–41)
Albumin: 3.6 g/dL (ref 3.5–5.0)
Alkaline Phosphatase: 51 U/L (ref 38–126)
Anion gap: 11 (ref 5–15)
BUN: 9 mg/dL (ref 6–20)
CO2: 22 mmol/L (ref 22–32)
Calcium: 8.6 mg/dL — ABNORMAL LOW (ref 8.9–10.3)
Chloride: 100 mmol/L (ref 98–111)
Creatinine, Ser: 0.73 mg/dL (ref 0.44–1.00)
GFR, Estimated: >60 mL/min (ref >60)
Glucose, Bld: 112 mg/dL — ABNORMAL HIGH (ref 70–99)
Potassium: 3.4 mmol/L — ABNORMAL LOW (ref 3.5–5.1)
Sodium: 133 mmol/L — ABNORMAL LOW (ref 135–145)
Total Bilirubin: 0.8 mg/dL (ref 0.0–1.2)
Total Protein: 7.3 g/dL (ref 6.5–8.1)

## 2023-08-16 LAB — CBC WITH DIFFERENTIAL/PLATELET
Abs Immature Granulocytes: 0.03 10*3/uL (ref 0.00–0.07)
Basophils Absolute: 0 10*3/uL (ref 0.0–0.1)
Basophils Relative: 0 %
Eosinophils Absolute: 0 10*3/uL (ref 0.0–0.5)
Eosinophils Relative: 0 %
HCT: 39.4 % (ref 36.0–46.0)
Hemoglobin: 12.5 g/dL (ref 12.0–15.0)
Immature Granulocytes: 0 %
Lymphocytes Relative: 21 %
Lymphs Abs: 1.6 10*3/uL (ref 0.7–4.0)
MCH: 25.9 pg — ABNORMAL LOW (ref 26.0–34.0)
MCHC: 31.7 g/dL (ref 30.0–36.0)
MCV: 81.7 fL (ref 80.0–100.0)
Monocytes Absolute: 0.6 10*3/uL (ref 0.1–1.0)
Monocytes Relative: 8 %
Neutro Abs: 5.5 10*3/uL (ref 1.7–7.7)
Neutrophils Relative %: 71 %
Platelets: 212 10*3/uL (ref 150–400)
RBC: 4.82 MIL/uL (ref 3.87–5.11)
RDW: 13 % (ref 11.5–15.5)
WBC: 7.8 10*3/uL (ref 4.0–10.5)
nRBC: 0 % (ref 0.0–0.2)

## 2023-08-16 LAB — PREGNANCY, URINE: Preg Test, Ur: NEGATIVE

## 2023-08-16 LAB — RESP PANEL BY RT-PCR (RSV, FLU A&B, COVID)  RVPGX2
Influenza A by PCR: NEGATIVE
Influenza B by PCR: NEGATIVE
Resp Syncytial Virus by PCR: NEGATIVE
SARS Coronavirus 2 by RT PCR: NEGATIVE

## 2023-08-16 MED ORDER — PROCHLORPERAZINE EDISYLATE 10 MG/2ML IJ SOLN
10.0000 mg | Freq: Once | INTRAMUSCULAR | Status: AC
  Administered 2023-08-16: 10 mg via INTRAVENOUS
  Filled 2023-08-16: qty 2

## 2023-08-16 MED ORDER — SODIUM CHLORIDE 0.9 % IV BOLUS
1000.0000 mL | Freq: Once | INTRAVENOUS | Status: AC
  Administered 2023-08-16: 1000 mL via INTRAVENOUS

## 2023-08-16 MED ORDER — KETOROLAC TROMETHAMINE 15 MG/ML IJ SOLN
15.0000 mg | Freq: Once | INTRAMUSCULAR | Status: AC
  Administered 2023-08-16: 15 mg via INTRAVENOUS
  Filled 2023-08-16: qty 1

## 2023-08-16 MED ORDER — DIPHENHYDRAMINE HCL 50 MG/ML IJ SOLN
12.5000 mg | Freq: Once | INTRAMUSCULAR | Status: AC
  Administered 2023-08-16: 12.5 mg via INTRAVENOUS
  Filled 2023-08-16: qty 1

## 2023-08-16 MED ORDER — AZITHROMYCIN 250 MG PO TABS: 250.0000 mg | ORAL_TABLET | Freq: Every day | ORAL | 0 refills | Status: AC

## 2023-08-16 MED ORDER — DEXAMETHASONE SODIUM PHOSPHATE 10 MG/ML IJ SOLN
10.0000 mg | Freq: Once | INTRAMUSCULAR | Status: AC
  Administered 2023-08-16: 10 mg via INTRAVENOUS
  Filled 2023-08-16: qty 1

## 2023-08-16 NOTE — ED Triage Notes (Signed)
 The patient has had cough, headache, body aches, fatigue and dizziness for three days.

## 2023-08-16 NOTE — Discharge Instructions (Signed)
 It was a pleasure taking care of you here in the emergency department  You have a pneumonia on your chest x-ray which is why you likely feel poorly.  I have written you for some antibiotics.  Take as prescribed  Make sure to hydrate and rest at home  Return for new or worsening symptoms otherwise follow-up outpatient with your primary care provider

## 2023-08-16 NOTE — ED Provider Notes (Signed)
EMERGENCY DEPARTMENT AT [facility] Provider Note   CSN: [id number] Arrival date & time: 08/16/23  1046     History  Chief Complaint  Patient presents with   Fatigue   Dizziness   Cough    Tammy Cochran is a 33 y.o. female here for evaluation of feeling unwell.  Patient with cough, headache, myalgias, fatigue and dizziness over the last 3 days.  No sudden onset thunderclap headache, headache has been persistent.  Located frontal aspect of head.  No recent head trauma.  She is also had dizziness which she describes as room spinning.  Improved with staying still.  She has had cough which is nonproductive.  No chest pain, shortness of breath.  Feels generally weak.  No unilateral weakness, numbness.  No vision changes.  HPI     Home Medications Prior to Admission medications   Medication Sig Start Date End Date Taking? Authorizing Provider  azithromycin  (ZITHROMAX ) 250 MG tablet Take 1 tablet (250 mg total) by mouth daily. Take first 2 tablets together, then 1 every day until finished. 08/16/23  Yes Idolina Mantell A, PA-C  amoxicillin -clavulanate (AUGMENTIN ) 875-125 MG tablet Take 1 tablet by mouth every 12 (twelve) hours. 08/27/22   Billy Asberry FALCON, PA-C  benzonatate  (TESSALON ) 100 MG capsule Take 1 capsule (100 mg total) by mouth every 8 (eight) hours as needed for cough. 10/07/21   Hazen Darryle BRAVO, FNP  docusate sodium  (COLACE) 100 MG capsule Take 1 capsule (100 mg total) 2 (two) times daily by mouth. 06/26/17   Ozan, Jennifer, DO  ferrous sulfate  325 (65 FE) MG tablet Take 325 mg by mouth daily with breakfast.    [provider]  fluticasone  (FLONASE ) 50 MCG/ACT nasal spray Place 1 spray into both nostrils daily for 3 days. 10/07/21 10/10/21  Hazen Darryle BRAVO, FNP  ibuprofen  (ADVIL ,MOTRIN ) 600 MG tablet Take 1 tablet (600 mg total) every 6 (six) hours by mouth. 06/26/17   Ozan, Jennifer, DO  oxyCODONE -acetaminophen  (PERCOCET/ROXICET) 5-325 MG tablet Take 1  tablet every 6 (six) hours as needed by mouth (pain scale 4-7). 06/26/17   Ozan, Jennifer, DO  Prenatal MV-Min-FA-Omega-3 (PRENATAL GUMMIES/DHA & FA PO) Take 1 each daily by mouth.    [provider]      Allergies    Patient has no known allergies.    Review of Systems   Review of Systems  Constitutional:  Positive for activity change and fatigue.  HENT: Negative.    Eyes: Negative.   Respiratory:  Positive for cough.   Cardiovascular: Negative.   Gastrointestinal:  Positive for nausea and vomiting. Negative for abdominal distention, abdominal pain, anal bleeding, blood in stool, constipation, diarrhea and rectal pain.  Musculoskeletal: Negative.   Skin: Negative.   Neurological:  Positive for weakness (generalized) and headaches.  All other systems reviewed and are negative.   Physical Exam Updated Vital Signs BP 108/70   Pulse 95   Temp 98.6 F (37 C) (Oral)   Resp 16   Ht 5' 1 (1.549 m)   Wt 99.8 kg   SpO2 100%   BMI 41.57 kg/m  Physical Exam Physical Exam  Constitutional: Pt is oriented to person, place, and time. Pt appears well-developed and well-nourished. No distress.  HENT:  Head: Normocephalic and atraumatic.  Mouth/Throat: Oropharynx is clear and moist.  Eyes: Conjunctivae and EOM are normal. Pupils are equal, round, and reactive to light. No scleral icterus.  No horizontal, vertical or rotational nystagmus  Neck: Normal  range of motion. Neck supple.  Full active and passive ROM without pain No midline or paraspinal tenderness No nuchal rigidity or meningeal signs  Cardiovascular: Normal rate, regular rhythm and intact distal pulses.   Pulmonary/Chest: Effort normal and breath sounds normal. No respiratory distress. Pt has no wheezes. No rales.  Abdominal: Soft. Bowel sounds are normal. There is no tenderness. There is no rebound and no guarding.  Musculoskeletal: Normal range of motion.  Lymphadenopathy:    No cervical adenopathy.   Neurological: Pt. is alert and oriented to person, place, and time. He has normal reflexes. No cranial nerve deficit.  Exhibits normal muscle tone. Coordination normal.  Mental Status:  Alert, oriented, thought content appropriate. Speech fluent without evidence of aphasia. Able to follow 2 step commands without difficulty.  Cranial Nerves:  2-12 grossly intact Motor:  Equal strength bil Sensory: intact sensation BIL Cerebellar: normal finger-to-nose with bilateral upper extremities Gait: normal gait and balance CV: distal pulses palpable throughout   Skin: Skin is warm and dry. No rash noted. Pt is not diaphoretic.  Psychiatric: Pt has a normal mood and affect. Behavior is normal. Judgment and thought content normal.  Nursing note and vitals reviewed.  ED Results / Procedures / Treatments   Labs (all labs ordered are listed, but only abnormal results are displayed) Labs Reviewed  CBC WITH DIFFERENTIAL/PLATELET - Abnormal; Notable for the following components:      Result Value   MCH 25.9 (*)    All other components within normal limits  COMPREHENSIVE METABOLIC PANEL - Abnormal; Notable for the following components:   Sodium 133 (*)    Potassium 3.4 (*)    Glucose, Bld 112 (*)    Calcium 8.6 (*)    All other components within normal limits  RESP PANEL BY RT-PCR (RSV, FLU A&B, COVID)  RVPGX2  PREGNANCY, URINE    EKG EKG Interpretation Date/Time:  Saturday August 16 2023 12:50:24 EST Ventricular Rate:  96 PR Interval:  126 QRS Duration:  84 QT Interval:  301 QTC Calculation: 381 R Axis:   37  Text Interpretation: Sinus rhythm Borderline repolarization abnormality Confirmed by Ruthe Cornet [phone]) on 08/16/2023 3:11:42 PM  Radiology DG Chest 1 View Result Date: 08/16/2023 CLINICAL DATA:  10031 Cough 10031 EXAM: CHEST  1 VIEW COMPARISON:  Earlier film of the same day FINDINGS: Persistent left lower lobe infiltrate, slightly less conspicuous. Right lung clear. Opacities  overlying the left lateral hemithorax have been removed. Heart size and mediastinal contours are within normal limits. No effusion. Visualized bones unremarkable. IMPRESSION: Improving left lower lobe infiltrate. Electronically Signed   By: JONETTA Faes M.D.   On: 08/16/2023 14:16   DG Chest 2 View Result Date: 08/16/2023 CLINICAL DATA:  cough EXAM: CHEST - 2 VIEW COMPARISON:  Chest x-ray 09/25/2014 FINDINGS: The heart and mediastinal contours are within normal limits. Nodular-like densities overlying the patient on frontal view (possibly hair) likely external to the patient-limiting evaluation of the left lateral mid lung zone. No definite correlation on lateral view. No focal consolidation. No pulmonary edema. No pleural effusion. No pneumothorax. No acute osseous abnormality. IMPRESSION: No active cardiopulmonary disease. Nodular-like densities overlying the patient on frontal view (possibly hair) likely external to the patient-limiting evaluation of the left lateral mid lung zone. Consider repeat PA chest x-ray. Electronically Signed   By: Morgane  Naveau M.D.   On: 08/16/2023 13:29    Procedures Procedures    Medications Ordered in ED Medications  ketorolac  (TORADOL ) 15 MG/ML  injection 15 mg (15 mg Intravenous Given 08/16/23 1725)  prochlorperazine  (COMPAZINE ) injection 10 mg (10 mg Intravenous Given 08/16/23 1728)  diphenhydrAMINE  (BENADRYL ) injection 12.5 mg (12.5 mg Intravenous Given 08/16/23 1726)  sodium chloride  0.9 % bolus 1,000 mL (1,000 mLs Intravenous New Bag/Given 08/16/23 1725)  dexamethasone  (DECADRON ) injection 10 mg (10 mg Intravenous Given 08/16/23 1730)    ED Course/ Medical Decision Making/ A&P   Here for evaluation of feeling unwell.  Patient with headache, myalgias, fatigue, cough, dizziness.  She is afebrile, nonseptic, non-ill-appearing.  No sudden onset thunderclap headache or head trauma.  She has a nonfocal neuroexam without deficit.  Describes dizziness as sensation of room  spinning.  Her heart and lungs are clear.  Abdomen is soft, tender.  She is no clinical evidence of VTE on exam.  Will plan on labs, imaging, headache cocktail and reassess  Labs and imaging personally viewed and interpreted:  CBC without leukocytosis Metabolic panel sodium 133, potassium 3.4, glucose 112 Preg negative COVID, flu, RSV negative X-ray left lower lobe pneumonia EKG without ischemic changes  Patient given migraine cocktail.  Symptoms improved.  Continues have a nonfocal neuroexam without deficit, tolerating p.o. intake.  Will treat with azithromycin  for pneumonia.  At this time I low suspicion for sepsis, dissection, brain mass, bleed, CVA, ICH, IIH, acute angle glaucoma, PE, pneumothorax as cause of her symptoms  The patient has been appropriately medically screened and/or stabilized in the ED. I have low suspicion for any other emergent medical condition which would require further screening, evaluation or treatment in the ED or require inpatient management.  Patient is hemodynamically stable and in no acute distress.  Patient able to ambulate in department prior to ED.  Evaluation does not show acute pathology that would require ongoing or additional emergent interventions while in the emergency department or further inpatient treatment.  I have discussed the diagnosis with the patient and answered all questions.  Pain is been managed while in the emergency department and patient has no further complaints prior to discharge.  Patient is comfortable with plan discussed in room and is stable for discharge at this time.  I have discussed strict return precautions for returning to the emergency department.  Patient was encouraged to follow-up with PCP/specialist refer to at discharge.                                Medical Decision Making Amount and/or Complexity of Data Reviewed Independent Historian:     Details: Family in room External Data Reviewed: labs, radiology, ECG and  notes. Labs: ordered. Decision-making details documented in ED Course. Radiology: ordered and independent interpretation performed. Decision-making details documented in ED Course. ECG/medicine tests: ordered and independent interpretation performed. Decision-making details documented in ED Course.  Risk OTC drugs. Prescription drug management. Parenteral controlled substances. Decision regarding hospitalization. Diagnosis or treatment significantly limited by social determinants of health.           Final Clinical Impression(s) / ED Diagnoses Final diagnoses:  Pneumonia of left lower lobe due to infectious organism    Rx / DC Orders ED Discharge Orders          Ordered    azithromycin  (ZITHROMAX ) 250 MG tablet  Daily        08/16/23 1856              Almond Fitzgibbon A, PA-C 08/16/23 1902    Ruthe Cornet, DO 08/16/23  2243  

## 2024-01-25 ENCOUNTER — Other Ambulatory Visit: Payer: Self-pay

## 2024-01-25 ENCOUNTER — Encounter (HOSPITAL_BASED_OUTPATIENT_CLINIC_OR_DEPARTMENT_OTHER): Payer: Self-pay | Admitting: *Deleted

## 2024-01-25 ENCOUNTER — Emergency Department (HOSPITAL_BASED_OUTPATIENT_CLINIC_OR_DEPARTMENT_OTHER)
Admission: EM | Admit: 2024-01-25 | Discharge: 2024-01-25 | Disposition: A | Discharge status: Home / Self Care | Attending: Emergency Medicine | Admitting: Emergency Medicine

## 2024-01-25 DIAGNOSIS — L509 Urticaria, unspecified: Secondary | ICD-10-CM | POA: Diagnosis present

## 2024-01-25 MED ORDER — HYDROXYZINE HCL 25 MG PO TABS: 25.0000 mg | ORAL_TABLET | Freq: Four times a day (QID) | ORAL | 0 refills | Status: AC

## 2024-01-25 MED ORDER — PREDNISONE 10 MG PO TABS
60.0000 mg | ORAL_TABLET | Freq: Once | ORAL | Status: AC
  Administered 2024-01-25: 60 mg via ORAL
  Filled 2024-01-25: qty 1

## 2024-01-25 MED ORDER — HYDROXYZINE HCL 25 MG PO TABS
25.0000 mg | ORAL_TABLET | Freq: Once | ORAL | Status: AC
  Administered 2024-01-25: 25 mg via ORAL
  Filled 2024-01-25: qty 1

## 2024-01-25 MED ORDER — PREDNISONE 10 MG PO TABS: 40.0000 mg | ORAL_TABLET | Freq: Every day | ORAL | 0 refills | Status: AC

## 2024-01-25 NOTE — ED Triage Notes (Signed)
 Pt is here for evaluation of allergic reaction which began Friday.  Pt is unsure what she is allergic to. On Friday she took benadryl  and had temporary relief.  Pt has itching rash on arms, legs and abdomen.  No swelling of tongue of mouth.  Pt states that she took Penicillin due to dental issue but the last tab was Tuesday, no new soap or lotions.  Pt states that she had a tick bite 3 weeks ago but there was no rash in the are after the tick bite.

## 2024-01-25 NOTE — ED Notes (Signed)
 Pt placed on monitor.

## 2024-01-25 NOTE — Discharge Instructions (Signed)
 You were seen in the ER today for concerns of a possible allergic reaction. Given that there is no clear trigger, I would advise trying to log any possible triggers over the next several days. In the meantime, I am sending you home with a prescription for a short course of steroids and hydroxyzine but return to the ER if you have any worsening symptoms.

## 2024-01-25 NOTE — ED Provider Notes (Signed)
 [place] [facility] AT [facility] [place] Provider Note   CSN: [id number] Arrival date & time: 01/25/24  1837     Patient presents with: Allergic Reaction   Tammy Cochran is a 33 y.o. female.  Patient presents to the emergency department with concerns of a possible allergic reaction.  Past medical history significant for anemia.  She reports that she began develop what she thought was an allergic reaction starting on Friday in which she had itching and hives present to the arms, legs, and abdomen.  She denies any time having any swelling of the tongue or mouth.  No reported difficulty breathing or wheezing.  She states that she recently took penicillin for dental issues with her last dose on Tuesday prior to onset of her symptoms.  No change with any supplements and no known food allergies.  She does remark about a tick bite 3 weeks prior but denies any feelings of myalgias, malaise, fevers, chills, body aches, or headaches.   Allergic Reaction Presenting symptoms: rash        Prior to Admission medications   Medication Sig Start Date End Date Taking? Authorizing Provider  hydrOXYzine (ATARAX) 25 MG tablet Take 1 tablet (25 mg total) by mouth every 6 (six) hours. 01/25/24  Yes Francisca Harbuck A, PA-C  predniSONE (DELTASONE) 10 MG tablet Take 4 tablets (40 mg total) by mouth daily for 4 days. 01/25/24 01/29/24 Yes Zury Fazzino A, PA-C  amoxicillin -clavulanate (AUGMENTIN ) 875-125 MG tablet Take 1 tablet by mouth every 12 (twelve) hours. 08/27/22   Vernestine Gondola, PA-C  azithromycin  (ZITHROMAX ) 250 MG tablet Take 1 tablet (250 mg total) by mouth daily. Take first 2 tablets together, then 1 every day until finished. 08/16/23   Henderly, Britni A, PA-C  benzonatate  (TESSALON ) 100 MG capsule Take 1 capsule (100 mg total) by mouth every 8 (eight) hours as needed for cough. 10/07/21   Dodson Freestone, FNP  docusate sodium  (COLACE) 100 MG capsule Take 1 capsule (100 mg total) 2 (two) times  daily by mouth. 06/26/17   Ozan, Jennifer, DO  ferrous sulfate  325 (65 FE) MG tablet Take 325 mg by mouth daily with breakfast.    [provider]  fluticasone  (FLONASE ) 50 MCG/ACT nasal spray Place 1 spray into both nostrils daily for 3 days. 10/07/21 10/10/21  Dodson Freestone, FNP  ibuprofen  (ADVIL ,MOTRIN ) 600 MG tablet Take 1 tablet (600 mg total) every 6 (six) hours by mouth. 06/26/17   Ozan, Jennifer, DO  oxyCODONE -acetaminophen  (PERCOCET/ROXICET) 5-325 MG tablet Take 1 tablet every 6 (six) hours as needed by mouth (pain scale 4-7). 06/26/17   Ozan, Jennifer, DO  Prenatal MV-Min-FA-Omega-3 (PRENATAL GUMMIES/DHA & FA PO) Take 1 each daily by mouth.    [provider]    Allergies: Patient has no known allergies.    Review of Systems  Skin:  Positive for rash.  All other systems reviewed and are negative.   Updated Vital Signs BP 110/69   Pulse (!) 118   Temp 98.8 F (37.1 C) (Oral)   Resp 15   SpO2 100%   Physical Exam Vitals and nursing note reviewed.  Constitutional:      General: She is not in acute distress.    Appearance: She is well-developed.  HENT:     Head: Normocephalic and atraumatic.   Eyes:     Conjunctiva/sclera: Conjunctivae normal.    Cardiovascular:     Rate and Rhythm: Normal rate and regular rhythm.  Heart sounds: No murmur heard. Pulmonary:     Effort: Pulmonary effort is normal. No respiratory distress.     Breath sounds: Normal breath sounds. No wheezing.  Abdominal:     Palpations: Abdomen is soft.     Tenderness: There is no abdominal tenderness.   Musculoskeletal:        General: No swelling.     Cervical back: Neck supple.   Skin:    General: Skin is warm and dry.     Capillary Refill: Capillary refill takes less than 2 seconds.     Comments: Very faint but diffuse erythematous macules primarily to the lower extremities at this time.  No raised hives.   Neurological:     Mental Status: She is alert.   Psychiatric:         Mood and Affect: Mood normal.     (all labs ordered are listed, but only abnormal results are displayed) Labs Reviewed - No data to display  EKG: None  Radiology: No results found.  Procedures   Medications Ordered in the ED  predniSONE (DELTASONE) tablet 60 mg (60 mg Oral Given 01/25/24 2012)  hydrOXYzine (ATARAX) tablet 25 mg (25 mg Oral Given 01/25/24 2012)                                  Medical Decision Making Risk Prescription drug management.   This patient presents to the ED for concern of allergic reaction.  Differential diagnosis includes contact dermatitis, food allergy, exercise-induced urticaria, anaphylaxis   Medicines ordered and prescription drug management:  I ordered medication including prednisone, hydroxyzine for allergic reaction Reevaluation of the patient after these medicines showed that the patient improved I have reviewed the patients home medicines and have made adjustments as needed   Problem List / ED Course:  Patient presents emergency department concerns for possible allergic reaction.  History significant for anemia.  She reports that over the last 2 days, she has developed hives and itching primarily to the extremities but also has had some irritation on the abdomen and chest.  Denies any feelings of chest pain or shortness of breath.  She denies any obvious triggers as far she is aware.  No known allergens.  Denies any changes with any of her cosmetic products or any detergents.  Denies any exposures to any sick contacts. On exam, there is multiple small erythematous macules present to upper and lower extremities with no raised borders.  No pain on palpation over these areas.  No abnormal heart or lung sounds.  No significant oropharyngeal erythema or swelling.  Will administer a dose of prednisone and hydroxyzine for management of symptoms. On reevaluation, patient reports improvement in her symptoms.  Her tachycardia that she had  arrived with has improved/resolved.  Most recently while in the room, heart rate in the upper 90s.  With no shortness of breath, doubtful of PE.  She is PERC negative.  With improvement in symptoms, discharged home with a short course of a prednisone burst for the neck several days.  Also discharged home with hydroxyzine for management of symptoms.  Advised patient to log any possible triggers including food, detergents, or soaps that may be causing her symptoms.  Return precautions discussed such as worsening symptoms or development of fever, chills, body aches, or headache.  Final diagnoses:  Urticaria    ED Discharge Orders  Ordered    predniSONE (DELTASONE) 10 MG tablet  Daily        01/25/24 2123    hydrOXYzine (ATARAX) 25 MG tablet  Every 6 hours        01/25/24 2123               Tiquan Bouch A, PA-C 01/25/24 2328    Dalene Duck, MD 01/27/24 [phone]
